# Patient Record
Sex: Male | Born: 1990 | Race: Black or African American | Hispanic: No | Marital: Single | Smoking: Never smoker
Health system: Southern US, Community
[De-identification: ages and names within clinical notes are randomized; demographics above are authoritative.]

## PROBLEM LIST (undated history)

## (undated) DIAGNOSIS — J45909 Unspecified asthma, uncomplicated: Secondary | ICD-10-CM

## (undated) HISTORY — PX: LIPOMA EXCISION: SHX5283

---

## 2012-10-18 ENCOUNTER — Encounter (HOSPITAL_COMMUNITY): Payer: Self-pay | Admitting: *Deleted

## 2012-10-18 ENCOUNTER — Emergency Department (HOSPITAL_COMMUNITY): Payer: Managed Care, Other (non HMO)

## 2012-10-18 ENCOUNTER — Emergency Department (HOSPITAL_COMMUNITY)
Admission: EM | Admit: 2012-10-18 | Discharge: 2012-10-18 | Disposition: A | Discharge status: Home / Self Care | Payer: Managed Care, Other (non HMO) | Attending: Emergency Medicine | Admitting: Emergency Medicine

## 2012-10-18 DIAGNOSIS — J45909 Unspecified asthma, uncomplicated: Secondary | ICD-10-CM | POA: Insufficient documentation

## 2012-10-18 DIAGNOSIS — IMO0002 Reserved for concepts with insufficient information to code with codable children: Secondary | ICD-10-CM | POA: Insufficient documentation

## 2012-10-18 DIAGNOSIS — Z23 Encounter for immunization: Secondary | ICD-10-CM | POA: Insufficient documentation

## 2012-10-18 DIAGNOSIS — T07XXXA Unspecified multiple injuries, initial encounter: Secondary | ICD-10-CM | POA: Insufficient documentation

## 2012-10-18 HISTORY — DX: Unspecified asthma, uncomplicated: J45.909

## 2012-10-18 MED ORDER — IBUPROFEN 800 MG PO TABS
800.0000 mg | ORAL_TABLET | Freq: Once | ORAL | Status: AC
  Administered 2012-10-18: 800 mg via ORAL
  Filled 2012-10-18: qty 1

## 2012-10-18 MED ORDER — HYDROCODONE-ACETAMINOPHEN 5-325 MG PO TABS: 2.0000 | ORAL_TABLET | ORAL | Status: DC | PRN

## 2012-10-18 MED ORDER — TETANUS-DIPHTH-ACELL PERTUSSIS 5-2.5-18.5 LF-MCG/0.5 IM SUSP
0.5000 mL | Freq: Once | INTRAMUSCULAR | Status: AC
  Administered 2012-10-18: 0.5 mL via INTRAMUSCULAR
  Filled 2012-10-18: qty 0.5

## 2012-10-18 MED ORDER — CYCLOBENZAPRINE HCL 10 MG PO TABS: 10.0000 mg | ORAL_TABLET | Freq: Two times a day (BID) | ORAL | Status: DC | PRN

## 2012-10-18 MED ORDER — IBUPROFEN 800 MG PO TABS: 800.0000 mg | ORAL_TABLET | Freq: Three times a day (TID) | ORAL | Status: DC
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
## 2012-10-18 NOTE — ED Notes (Signed)
Patient transported to X-ray 

## 2012-11-04 ENCOUNTER — Encounter (HOSPITAL_COMMUNITY): Payer: Self-pay | Admitting: Emergency Medicine

## 2012-11-04 ENCOUNTER — Emergency Department (HOSPITAL_COMMUNITY)
Admission: EM | Admit: 2012-11-04 | Discharge: 2012-11-04 | Disposition: A | Discharge status: Home / Self Care | Payer: Managed Care, Other (non HMO) | Attending: Emergency Medicine | Admitting: Emergency Medicine

## 2012-11-04 DIAGNOSIS — Y939 Activity, unspecified: Secondary | ICD-10-CM | POA: Insufficient documentation

## 2012-11-04 DIAGNOSIS — Z79899 Other long term (current) drug therapy: Secondary | ICD-10-CM | POA: Insufficient documentation

## 2012-11-04 DIAGNOSIS — IMO0002 Reserved for concepts with insufficient information to code with codable children: Secondary | ICD-10-CM

## 2012-11-04 DIAGNOSIS — Y929 Unspecified place or not applicable: Secondary | ICD-10-CM | POA: Insufficient documentation

## 2012-11-04 DIAGNOSIS — J45909 Unspecified asthma, uncomplicated: Secondary | ICD-10-CM | POA: Insufficient documentation

## 2012-11-04 DIAGNOSIS — S0180XA Unspecified open wound of other part of head, initial encounter: Secondary | ICD-10-CM | POA: Insufficient documentation

## 2012-11-04 DIAGNOSIS — X58XXXA Exposure to other specified factors, initial encounter: Secondary | ICD-10-CM | POA: Insufficient documentation

## 2012-11-04 NOTE — ED Provider Notes (Signed)
History     CSN: [id number]  Arrival date & time 11/04/12  0225   First MD Initiated Contact with Patient 11/04/12 [phone]      Chief Complaint  Patient presents with  . Laceration    (Consider location/radiation/quality/duration/timing/severity/associated sxs/prior treatment) Patient is a 22 y.o. male presenting with skin laceration. The history is provided by the patient.  Laceration  The incident occurred 1 to 2 hours ago. Pain location: left eye brow. The laceration is 3 cm in size. Injury mechanism: busted open. The pain is at a severity of 0/10. The patient is experiencing no pain. He reports no foreign bodies present. His tetanus status is UTD.    Past Medical History  Diagnosis Date  . Asthma     Past Surgical History  Procedure Date  . Lipoma excision     No family history on file.  History  Substance Use Topics  . Smoking status: Never Smoker   . Smokeless tobacco: Not on file  . Alcohol Use: Yes     Comment: occasionally      Review of Systems  Skin: Positive for wound.  All other systems reviewed and are negative.    Allergies  Singulair  Home Medications   Current Outpatient Rx  Name  Route  Sig  Dispense  Refill  . ALBUTEROL SULFATE HFA 108 (90 BASE) MCG/ACT IN AERS   Inhalation   Inhale 2 puffs into the lungs every 6 (six) hours as needed. For wheeze or shortness of breath           BP 136/77  Pulse 85  Temp 97.7 F (36.5 C) (Oral)  Resp 16  SpO2 99%  Physical Exam  Nursing note and vitals reviewed. Constitutional: He is oriented to person, place, and time. He appears well-developed and well-nourished. No distress.  HENT:  Head: Normocephalic and atraumatic.  Eyes: Conjunctivae normal and EOM are normal.  Neck: Normal range of motion.  Pulmonary/Chest: Effort normal.  Musculoskeletal: Normal range of motion.  Neurological: He is alert and oriented to person, place, and time.  Skin: Skin is warm and dry. No rash noted. He is  not diaphoretic.       3 cm superficial horizontal laceration just inferior to brow line.   Psychiatric: He has a normal mood and affect. His behavior is normal.    ED Course  Procedures (including critical care time)  Labs Reviewed - No data to display No results found. LACERATION REPAIR Performed by: Jaci Carrel Authorized by: Jaci Carrel Consent: Verbal consent obtained. Risks and benefits: risks, benefits and alternatives were discussed Consent given by: patient Patient identity confirmed: provided demographic data Prepped and Draped in normal sterile fashion Wound explored  Laceration Location: right eye brow  Laceration Length: 3cm  No Foreign Bodies seen or palpated  Anesthesia: local infiltration  Local anesthetic: lidocaine 2% w epinephrine  Anesthetic total: 2 ml  Irrigation method: syringe Amount of cleaning: standard  Skin closure: 5.0 prolene  Number of sutures: 5  Technique: simple interupted  Patient tolerance: Patient tolerated the procedure well with no immediate complications.   No diagnosis found.    MDM  Laceration  Tdap up to date. Pressure irrigation performed. Laceration occurred < 8 hours prior to repair which was well tolerated. Pt has no co morbidities to effect normal wound healing. Discussed suture home care w pt and answered questions. Pt to f-u for wound check and suture removal in 5 days. Pt is hemodynamically stable w no  complaints prior to dc.           Jaci Carrel, [place] 11/04/12 [phone]

## 2012-11-04 NOTE — ED Provider Notes (Signed)
Medical screening examination/treatment/procedure(s) were performed by non-physician practitioner and as supervising physician I was immediately available for consultation/collaboration.  Jasmine Awe, MD 11/04/12 [phone]

## 2012-11-04 NOTE — ED Notes (Signed)
Laceration to right eyebrow occurred at 0200 today while playing basketball, hit another players arm.  Last tetanus shot 2 weeks ago. Bleeding controlled

## 2013-06-11 ENCOUNTER — Emergency Department (HOSPITAL_COMMUNITY): Payer: Managed Care, Other (non HMO)

## 2013-06-11 ENCOUNTER — Inpatient Hospital Stay (HOSPITAL_COMMUNITY): Payer: Managed Care, Other (non HMO)

## 2013-06-11 ENCOUNTER — Inpatient Hospital Stay (HOSPITAL_COMMUNITY)
Admission: EM | Admit: 2013-06-11 | Discharge: 2013-06-14 | DRG: 200 | Disposition: A | Discharge status: Home / Self Care | Payer: Managed Care, Other (non HMO) | Attending: Critical Care Medicine | Admitting: Critical Care Medicine

## 2013-06-11 ENCOUNTER — Encounter (HOSPITAL_COMMUNITY): Payer: Self-pay | Admitting: *Deleted

## 2013-06-11 DIAGNOSIS — E876 Hypokalemia: Secondary | ICD-10-CM | POA: Diagnosis present

## 2013-06-11 DIAGNOSIS — J982 Interstitial emphysema: Principal | ICD-10-CM | POA: Diagnosis present

## 2013-06-11 DIAGNOSIS — J40 Bronchitis, not specified as acute or chronic: Secondary | ICD-10-CM | POA: Diagnosis present

## 2013-06-11 DIAGNOSIS — J069 Acute upper respiratory infection, unspecified: Secondary | ICD-10-CM

## 2013-06-11 DIAGNOSIS — J45901 Unspecified asthma with (acute) exacerbation: Secondary | ICD-10-CM | POA: Diagnosis present

## 2013-06-11 LAB — CBC WITH DIFFERENTIAL/PLATELET
Eosinophils Relative: 2 % (ref 0–5)
HCT: 42.3 % (ref 39.0–52.0)
Lymphocytes Relative: 8 % — ABNORMAL LOW (ref 12–46)
Lymphs Abs: 0.7 10*3/uL (ref 0.7–4.0)
MCH: 27.8 pg (ref 26.0–34.0)
MCV: 80 fL (ref 78.0–100.0)
Monocytes Absolute: 0.6 10*3/uL (ref 0.1–1.0)
Monocytes Relative: 7 % (ref 3–12)
RBC: 5.29 MIL/uL (ref 4.22–5.81)
WBC: 8.7 10*3/uL (ref 4.0–10.5)

## 2013-06-11 LAB — RAPID URINE DRUG SCREEN, HOSP PERFORMED
Amphetamines: NOT DETECTED
Benzodiazepines: NOT DETECTED
Opiates: POSITIVE — AB
Tetrahydrocannabinol: POSITIVE — AB

## 2013-06-11 LAB — POCT I-STAT, CHEM 8
BUN: 5 mg/dL — ABNORMAL LOW (ref 6–23)
Calcium, Ion: 1.11 mmol/L — ABNORMAL LOW (ref 1.12–1.23)
Creatinine, Ser: 1.1 mg/dL (ref 0.50–1.35)
Glucose, Bld: 121 mg/dL — ABNORMAL HIGH (ref 70–99)
TCO2: 26 mmol/L (ref 0–100)

## 2013-06-11 LAB — GLUCOSE, CAPILLARY: Glucose-Capillary: 126 mg/dL — ABNORMAL HIGH (ref 70–99)

## 2013-06-11 MED ORDER — ALBUTEROL SULFATE (5 MG/ML) 0.5% IN NEBU
2.5000 mg | INHALATION_SOLUTION | RESPIRATORY_TRACT | Status: DC
  Administered 2013-06-11 – 2013-06-12 (×6): 2.5 mg via RESPIRATORY_TRACT
  Filled 2013-06-11 (×6): qty 0.5

## 2013-06-11 MED ORDER — HEPARIN SODIUM (PORCINE) 5000 UNIT/ML IJ SOLN
5000.0000 [IU] | Freq: Three times a day (TID) | INTRAMUSCULAR | Status: DC
  Administered 2013-06-11 – 2013-06-14 (×9): 5000 [IU] via SUBCUTANEOUS
  Filled 2013-06-11 (×14): qty 1

## 2013-06-11 MED ORDER — ACETAMINOPHEN 325 MG PO TABS: 650.0000 mg | ORAL_TABLET | Freq: Once | ORAL | Status: DC

## 2013-06-11 MED ORDER — AZITHROMYCIN 250 MG PO TABS
250.0000 mg | ORAL_TABLET | Freq: Every day | ORAL | Status: DC
  Filled 2013-06-11: qty 2

## 2013-06-11 MED ORDER — PREDNISONE 10 MG PO TABS
10.0000 mg | ORAL_TABLET | Freq: Every day | ORAL | Status: DC
  Filled 2013-06-11: qty 2
  Filled 2013-06-11 (×2): qty 1

## 2013-06-11 MED ORDER — ALBUTEROL SULFATE (5 MG/ML) 0.5% IN NEBU
5.0000 mg | INHALATION_SOLUTION | Freq: Once | RESPIRATORY_TRACT | Status: AC
  Administered 2013-06-11: 5 mg via RESPIRATORY_TRACT
  Filled 2013-06-11: qty 1

## 2013-06-11 MED ORDER — AZITHROMYCIN 250 MG PO TABS
250.0000 mg | ORAL_TABLET | Freq: Every day | ORAL | Status: DC
  Filled 2013-06-11: qty 1

## 2013-06-11 MED ORDER — MORPHINE SULFATE 2 MG/ML IJ SOLN
2.0000 mg | INTRAMUSCULAR | Status: DC | PRN
  Administered 2013-06-11 – 2013-06-12 (×5): 2 mg via INTRAVENOUS
  Filled 2013-06-11 (×5): qty 1

## 2013-06-11 MED ORDER — SODIUM CHLORIDE 0.9 % IV SOLN
INTRAVENOUS | Status: DC
  Administered 2013-06-11: 20 mL/h via INTRAVENOUS

## 2013-06-11 MED ORDER — METHYLPREDNISOLONE SODIUM SUCC 125 MG IJ SOLR
80.0000 mg | Freq: Four times a day (QID) | INTRAMUSCULAR | Status: DC
  Administered 2013-06-11 – 2013-06-12 (×4): 80 mg via INTRAVENOUS
  Filled 2013-06-11 (×9): qty 1.28

## 2013-06-11 MED ORDER — ALBUTEROL SULFATE (5 MG/ML) 0.5% IN NEBU
2.5000 mg | INHALATION_SOLUTION | Freq: Four times a day (QID) | RESPIRATORY_TRACT | Status: DC
  Administered 2013-06-11 (×2): 2.5 mg via RESPIRATORY_TRACT
  Filled 2013-06-11 (×3): qty 0.5

## 2013-06-11 MED ORDER — POTASSIUM CHLORIDE 20 MEQ PO PACK: 40.0000 meq | PACK | Freq: Two times a day (BID) | ORAL | Status: DC

## 2013-06-11 MED ORDER — IPRATROPIUM BROMIDE 0.02 % IN SOLN
0.5000 mg | Freq: Once | RESPIRATORY_TRACT | Status: AC
  Administered 2013-06-11: 0.5 mg via RESPIRATORY_TRACT
  Filled 2013-06-11: qty 2.5

## 2013-06-11 MED ORDER — BENZONATATE 100 MG PO CAPS
200.0000 mg | ORAL_CAPSULE | Freq: Three times a day (TID) | ORAL | Status: DC | PRN
  Administered 2013-06-11: 200 mg via ORAL
  Filled 2013-06-11: qty 2

## 2013-06-11 MED ORDER — METHYLPREDNISOLONE SODIUM SUCC 125 MG IJ SOLR
125.0000 mg | Freq: Once | INTRAMUSCULAR | Status: DC
  Filled 2013-06-11: qty 2

## 2013-06-11 MED ORDER — LEVOFLOXACIN IN D5W 750 MG/150ML IV SOLN
750.0000 mg | INTRAVENOUS | Status: DC
  Administered 2013-06-11: 750 mg via INTRAVENOUS
  Filled 2013-06-11 (×2): qty 150

## 2013-06-11 MED ORDER — POTASSIUM CHLORIDE CRYS ER 20 MEQ PO TBCR
40.0000 meq | EXTENDED_RELEASE_TABLET | Freq: Two times a day (BID) | ORAL | Status: AC
  Administered 2013-06-11 (×2): 40 meq via ORAL
  Filled 2013-06-11 (×2): qty 2

## 2013-06-11 NOTE — Progress Notes (Addendum)
PCCM  Pt seen in f/u.  Pt still bronchospastic. Only on q6h BD and po pred and azithro.  Chest :  Exp/insp wheeze.   Sub cut emphysema Cor RRR   No s3 Rest of exam unremarkable  IMP: Status asthmaticus Pneumomediastinum  Plan IV levaquin IV medrol Nebs q3H Keep in icu F/u CXR  Preston Marquez Beeper  [phone]  Cell  [phone]  If no response or cell goes to voicemail, call beeper [phone]

## 2013-06-11 NOTE — ED Provider Notes (Signed)
CSN: [id number]     Arrival date & time 06/11/13  0305 History     First MD Initiated Contact with Patient 06/11/13 [phone]     Chief Complaint  Patient presents with  . Asthma  . Wheezing  . Fever   (Consider location/radiation/quality/duration/timing/severity/associated sxs/prior Treatment) Patient is a 22 y.o. male presenting with asthma, wheezing, and fever. The history is provided by the patient.  Asthma This is a recurrent problem. The current episode started 2 days ago. The problem occurs constantly. The problem has been gradually worsening. Associated symptoms include shortness of breath. Pertinent negatives include no headaches. Nothing aggravates the symptoms. Nothing relieves the symptoms. He has tried nothing for the symptoms. The treatment provided no relief.  Wheezing Severity:  Severe Severity compared to prior episodes:  Similar Onset quality:  Sudden Timing:  Constant Progression:  Worsening Chronicity:  Recurrent Context: not exercise   Relieved by:  Nothing Worsened by:  Nothing tried Ineffective treatments:  Nebulizer treatments Associated symptoms: chest tightness, fever and shortness of breath   Associated symptoms: no headaches and no sputum production   Fever:    Timing:  Constant   Temp source:  Oral Risk factors: no suspected foreign body   Fever Associated symptoms: no headaches     Past Medical History  Diagnosis Date  . Asthma    Past Surgical History  Procedure Laterality Date  . Lipoma excision     No family history on file. History  Substance Use Topics  . Smoking status: Never Smoker   . Smokeless tobacco: Not on file  . Alcohol Use: Yes     Comment: occasionally    Review of Systems  Constitutional: Positive for fever.  Respiratory: Positive for chest tightness, shortness of breath and wheezing. Negative for sputum production.   Neurological: Negative for headaches.  All other systems reviewed and are negative.    Allergies   Singulair  Home Medications   Current Outpatient Rx  Name  Route  Sig  Dispense  Refill  . azithromycin (ZITHROMAX) 250 MG tablet   Oral   Take 250-500 mg by mouth daily. START 8.22.14 END 8.27.14 for 5 days, take 2 tablets on day 1 , then 1 tablet on days 2-4.         Marland Kitchen albuterol (PROVENTIL HFA;VENTOLIN HFA) 108 (90 BASE) MCG/ACT inhaler   Inhalation   Inhale 2 puffs into the lungs every 6 (six) hours as needed. For wheeze or shortness of breath          BP 160/76  Pulse 84  Temp(Src) 100.2 F (37.9 C) (Oral)  Resp 20  SpO2 93% Physical Exam  Constitutional: He is oriented to person, place, and time. He appears well-developed and well-nourished.  HENT:  Head: Normocephalic and atraumatic.  Mouth/Throat: Oropharynx is clear and moist.  Eyes: Conjunctivae are normal. Pupils are equal, round, and reactive to light.  Neck: Normal range of motion. Neck supple.  Crepitance of the neck cw air  Cardiovascular: Intact distal pulses.  Tachycardia present.   Pulmonary/Chest: Accessory muscle usage present. Tachypnea noted. He has wheezes. He has no rhonchi. He has no rales.  Abdominal: Soft. Bowel sounds are normal. There is no tenderness. There is no rebound and no guarding.  Musculoskeletal: Normal range of motion.  Lymphadenopathy:    He has no cervical adenopathy.  Neurological: He is alert and oriented to person, place, and time.  Skin: Skin is warm and dry. He is not diaphoretic.  Psychiatric: He  has a normal mood and affect.    ED Course   Procedures (including critical care time)  Labs Reviewed  CBC WITH DIFFERENTIAL - Abnormal; Notable for the following:    Neutrophils Relative % 82 (*)    Lymphocytes Relative 8 (*)    All other components within normal limits  POCT I-STAT, CHEM 8 - Abnormal; Notable for the following:    Potassium 3.1 (*)    BUN 5 (*)    Glucose, Bld 121 (*)    Calcium, Ion 1.11 (*)    All other components within normal limits   Dg Chest  2 View  06/11/2013   *RADIOLOGY REPORT*  Clinical Data: Asthma, wheezing, fever.  CHEST - 2 VIEW  Comparison: 10/18/2012  Findings: There is fairly extensive pneumomediastinum with subcutaneous emphysema in the base of the neck and upper chest wall bilaterally.  No definite pneumothorax but superimposed changes due to pneumomediastinum could obscure small pneumothorax. Hyperinflation of the lungs. This is likely related to history of asthma.  Normal heart size and pulmonary vascularity.  No focal airspace consolidation in the lungs.  No blunting of costophrenic angles.  IMPRESSION: Extensive pneumomediastinum with subcutaneous emphysema in the neck and upper chest bilaterally.  Hyperinflation without obvious pneumothorax.  Results were telephoned to Dr. Nicanor Alcon at 0356 hours on 06/11/2013.   Original Report Authenticated By: Burman Nieves, M.D.   No diagnosis found.  MDM  MDM Reviewed: nursing note and vitals Interpretation: labs and x-ray Total time providing critical care: 30-74 minutes. This excludes time spent performing separately reportable procedures and services. Consults: pulmonary   Results for orders placed during the hospital encounter of 06/11/13  CBC WITH DIFFERENTIAL      Result Value Range   WBC 8.7  4.0 - 10.5 K/uL   RBC 5.29  4.22 - 5.81 MIL/uL   Hemoglobin 14.7  13.0 - 17.0 g/dL   HCT 40.9  81.1 - 91.4 %   MCV 80.0  78.0 - 100.0 fL   MCH 27.8  26.0 - 34.0 pg   MCHC 34.8  30.0 - 36.0 g/dL   RDW 78.2  95.6 - 21.3 %   Platelets 170  150 - 400 K/uL   Neutrophils Relative % 82 (*) 43 - 77 %   Neutro Abs 7.2  1.7 - 7.7 K/uL   Lymphocytes Relative 8 (*) 12 - 46 %   Lymphs Abs 0.7  0.7 - 4.0 K/uL   Monocytes Relative 7  3 - 12 %   Monocytes Absolute 0.6  0.1 - 1.0 K/uL   Eosinophils Relative 2  0 - 5 %   Eosinophils Absolute 0.2  0.0 - 0.7 K/uL   Basophils Relative 0  0 - 1 %   Basophils Absolute 0.0  0.0 - 0.1 K/uL  POCT I-STAT, CHEM 8      Result Value Range    Sodium 140  135 - 145 mEq/L   Potassium 3.1 (*) 3.5 - 5.1 mEq/L   Chloride 102  96 - 112 mEq/L   BUN 5 (*) 6 - 23 mg/dL   Creatinine, Ser 0.86  0.50 - 1.35 mg/dL   Glucose, Bld 578 (*) 70 - 99 mg/dL   Calcium, Ion 4.69 (*) 1.12 - 1.23 mmol/L   TCO2 26  0 - 100 mmol/L   Hemoglobin 15.6  13.0 - 17.0 g/dL   HCT 62.9  52.8 - 41.3 %   Dg Chest 2 View  06/11/2013   *RADIOLOGY REPORT*  Clinical  Data: Asthma, wheezing, fever.  CHEST - 2 VIEW  Comparison: 10/18/2012  Findings: There is fairly extensive pneumomediastinum with subcutaneous emphysema in the base of the neck and upper chest wall bilaterally.  No definite pneumothorax but superimposed changes due to pneumomediastinum could obscure small pneumothorax. Hyperinflation of the lungs. This is likely related to history of asthma.  Normal heart size and pulmonary vascularity.  No focal airspace consolidation in the lungs.  No blunting of costophrenic angles.  IMPRESSION: Extensive pneumomediastinum with subcutaneous emphysema in the neck and upper chest bilaterally.  Hyperinflation without obvious pneumothorax.  Results were telephoned to Dr. Nicanor Alcon at 0356 hours on 06/11/2013.   Original Report Authenticated By: Burman Nieves, M.D.    Medications  methylPREDNISolone sodium succinate (SOLU-MEDROL) 125 mg/2 mL injection 125 mg (not administered)  albuterol (PROVENTIL) (5 MG/ML) 0.5% nebulizer solution 5 mg (not administered)  ipratropium (ATROVENT) nebulizer solution 0.5 mg (not administered)  albuterol (PROVENTIL) (5 MG/ML) 0.5% nebulizer solution 5 mg (5 mg Nebulization Given 06/11/13 0346)   O2 therapy  CRITICAL CARE Performed by: Jasmine Awe Total critical care time: 31 minutes Critical care time was exclusive of separately billable procedures and treating other patients. Critical care was necessary to treat or prevent imminent or life-threatening deterioration. Critical care was time spent personally by me on the following  activities: development of treatment plan with patient and/or surrogate as well as nursing, discussions with consultants, evaluation of patient's response to treatment, examination of patient, obtaining history from patient or surrogate, ordering and performing treatments and interventions, ordering and review of laboratory studies, ordering and review of radiographic studies, pulse oximetry and re-evaluation of patient's condition.   Jasmine Awe, MD 06/11/13 [phone]

## 2013-06-11 NOTE — ED Notes (Signed)
C/o sob, wheezing. H/o asthma. Onset Thursday, fever noted tonight. Pt seen in a [facility] urgent care Friday morning. Given zpack and ibuprofen.sx worse tonight. Increased wob noted.

## 2013-06-11 NOTE — ED Notes (Signed)
Pt states that he took Nyquil around 0130.

## 2013-06-11 NOTE — H&P (Signed)
PULMONARY  / CRITICAL CARE MEDICINE  Name: Preston Marquez MRN: [id number] DOB: 1991/01/12    ADMISSION DATE:  06/11/2013 CONSULTATION DATE:  06/11/2013  REFERRING MD :  Nicanor Alcon PRIMARY SERVICE: Emergency Medicine  CHIEF COMPLAINT:  Chest pain and shortness of breath  BRIEF PATIENT DESCRIPTION: Preston Marquez is a 22 year old male with asthma who presents with pneumomediastinum and chest pain in the setting of an asthma exacerbation.  SIGNIFICANT EVENTS / STUDIES:  1. Chest x-ray 06/11/2013 demonstrates pneumomediastinum  LINES / TUBES: 1. None  CULTURES: 1. None  ANTIBIOTICS: 1. None  HISTORY OF PRESENT ILLNESS:  Preston Marquez is a 22 year old male with a history of mild intermittent asthma who presents to [facility] this evening with shortness of breath and chest pain. His symptoms began yesterday with the onset of shortness of breath. This morning, he woke up and his chest felt heavy. It is noted that his skin and his chest was more prominent; he notes that he looked "buff". He presented to urgent care today and was told he had an asthma exacerbation. Given the severity of his symptoms he decided to present to [facility] as well. He denies fever, chills, nausea, vomiting, rhinorrhea, sore throat. He does not use drugs and has not suffered trauma recently.  PAST MEDICAL HISTORY :  Past Medical History  Diagnosis Date  . Asthma     Past Surgical History  Procedure Laterality Date  . Lipoma excision      Prior to Admission medications   Medication Sig Start Date End Date Taking? Authorizing Provider  azithromycin (ZITHROMAX) 250 MG tablet Take 250-500 mg by mouth daily. START 8.22.14 END 8.27.14 for 5 days, take 2 tablets on day 1 , then 1 tablet on days 2-4.   Yes Historical Provider, MD  albuterol (PROVENTIL HFA;VENTOLIN HFA) 108 (90 BASE) MCG/ACT inhaler Inhale 2 puffs into the lungs every 6 (six) hours as needed. For wheeze or shortness of breath    Historical Provider, MD     Allergies  Allergen Reactions  . Singulair [Montelukast Sodium] Hives    FAMILY HISTORY:  No family history on file.  SOCIAL HISTORY:  reports that he has never smoked. He does not have any smokeless tobacco history on file. He reports that  drinks alcohol. He reports that he does not use illicit drugs.  REVIEW OF SYSTEMS:  A 12 system review systems is conducted and, unless otherwise specified in history of present illness, was negative.  PHYSICAL EXAM  VITAL SIGNS: Temp:  [100.2 F (37.9 C)] 100.2 F (37.9 C) (08/23 0310) Pulse Rate:  [84-118] 84 (08/23 0347) Resp:  [20-22] 20 (08/23 0347) BP: (160)/(76) 160/76 mmHg (08/23 0310) SpO2:  [93 %] 93 % (08/23 0310)  HEMODYNAMICS:    VENTILATOR SETTINGS:    INTAKE / OUTPUT: Intake/Output   None     PHYSICAL EXAMINATION: General:  Well-developed well-nourished male in no acute distress Neuro:  Intact HEENT: Sclera anicteric, conjunctiva pink. Mucous membranes moist, oropharynx clear. Neck:  Trachea supple in midline, no lymphadenopathy or JVD. Palpable crepitus overlying the clavicles bilaterally. Cardiovascular:  Regular rate rhythm, normal S1-S2, no murmurs rubs or gallop Lungs:  Diffuse expiratory wheezing Abdomen:  Soft, nontender, nondistended, positive bowel sounds Musculoskeletal:  No clubbing cyanosis or edema Skin:  No rashes  LABS:  CBC Recent Labs     06/11/13  0315  06/11/13  0320  WBC  8.7   --   HGB  14.7  15.6  HCT  42.3  46.0  PLT  170   --     Coag's No results found for this basename: APTT, INR,  in the last 72 hours  BMET Recent Labs     06/11/13  0320  NA  140  K  3.1*  CL  102  BUN  5*  CREATININE  1.10  GLUCOSE  121*    Electrolytes No results found for this basename: CALCIUM, MG, PHOS,  in the last 72 hours  Sepsis Markers No results found for this basename: LACTICACIDVEN, PROCALCITON, O2SATVEN,  in the last 72 hours  ABG No results found for this basename:  PHART, PCO2ART, PO2ART,  in the last 72 hours  Liver Enzymes No results found for this basename: AST, ALT, ALKPHOS, BILITOT, ALBUMIN,  in the last 72 hours  Cardiac Enzymes No results found for this basename: TROPONINI, PROBNP,  in the last 72 hours  Glucose No results found for this basename: GLUCAP,  in the last 72 hours  Imaging Dg Chest 2 View  06/11/2013   *RADIOLOGY REPORT*  Clinical Data: Asthma, wheezing, fever.  CHEST - 2 VIEW  Comparison: 10/18/2012  Findings: There is fairly extensive pneumomediastinum with subcutaneous emphysema in the base of the neck and upper chest wall bilaterally.  No definite pneumothorax but superimposed changes due to pneumomediastinum could obscure small pneumothorax. Hyperinflation of the lungs. This is likely related to history of asthma.  Normal heart size and pulmonary vascularity.  No focal airspace consolidation in the lungs.  No blunting of costophrenic angles.  IMPRESSION: Extensive pneumomediastinum with subcutaneous emphysema in the neck and upper chest bilaterally.  Hyperinflation without obvious pneumothorax.  Results were telephoned to Dr. Nicanor Alcon at 0356 hours on 06/11/2013.   Original Report Authenticated By: Burman Nieves, M.D.    EKG: Not done. CXR: Chest x-ray from today was personally reviewed by me, there is diffuse subcutaneous emphysema  ASSESSMENT / PLAN: Principal Problem:   Pneumomediastinum Active Problems:   Asthma exacerbation   Hypokalemia   PULMONARY A: 1. Acute asthma exacerbation: This is the most likely underlying explanation for the patient's presentation. There is no evidence of pneumonia on chest x-ray.  P:    Prednisone taper  Standing and albuterol every 6  Minimize cough  CARDIOVASCULAR A:  1. Pneumomediastinum: This is most likely secondary to the patient's asthma exacerbation. He denies trauma or drug use which are also in the differential.  P:  Pain control  Minimize cough  Chest  CT  UDS  CT surgery evaluation in the morning  RENAL A:  1.  Hypokalemia 2. No other acute issues; the patient's creatinine is almost certainly secondary to his muscular in build:   P:     Repeat K.  GASTROINTESTINAL A:  No acute issue  HEMATOLOGIC A:   No acute issue  INFECTIOUS A: No acute issue  ENDOCRINE A:    No acute issue  NEUROLOGIC A:  No acute issue  BEST PRACTICE / DISPOSITION Level of Care:  ICU Consultants:   cardiothoracic surgery in the morning Code Status:   full Diet:   regular DVT Px:   ambulatory GI Px:   not indicated Skin Integrity:   intact Social / Family:  patient able to advise  TODAY'S SUMMARY:   I have personally obtained a history, examined the patient, evaluated laboratory and imaging results, formulated the assessment and plan and placed orders.  CRITICAL CARE: The patient is critically ill with multiple organ systems failure and requires  high complexity decision making for assessment and support, frequent evaluation and titration of therapies, application of advanced monitoring technologies and extensive interpretation of multiple databases. Critical Care Time devoted to patient care services described in this note is 30 minutes.   Evalyn Casco, MD Pulmonary and Critical Care Medicine [facility] Pager: [phone]  06/11/2013, 4:36 AM

## 2013-06-12 ENCOUNTER — Inpatient Hospital Stay (HOSPITAL_COMMUNITY): Payer: Managed Care, Other (non HMO)

## 2013-06-12 DIAGNOSIS — J45901 Unspecified asthma with (acute) exacerbation: Secondary | ICD-10-CM

## 2013-06-12 DIAGNOSIS — J982 Interstitial emphysema: Principal | ICD-10-CM

## 2013-06-12 LAB — BASIC METABOLIC PANEL
BUN: 12 mg/dL (ref 6–23)
CO2: 27 mEq/L (ref 19–32)
Calcium: 10.2 mg/dL (ref 8.4–10.5)
Chloride: 100 mEq/L (ref 96–112)
Creatinine, Ser: 1.01 mg/dL (ref 0.50–1.35)
Glucose, Bld: 128 mg/dL — ABNORMAL HIGH (ref 70–99)

## 2013-06-12 LAB — CBC
HCT: 42 % (ref 39.0–52.0)
MCH: 27.7 pg (ref 26.0–34.0)
MCHC: 34.8 g/dL (ref 30.0–36.0)
MCV: 79.7 fL (ref 78.0–100.0)
Platelets: 204 10*3/uL (ref 150–400)
RDW: 14.6 % (ref 11.5–15.5)
WBC: 8.1 10*3/uL (ref 4.0–10.5)

## 2013-06-12 MED ORDER — ALBUTEROL SULFATE (5 MG/ML) 0.5% IN NEBU
2.5000 mg | INHALATION_SOLUTION | Freq: Four times a day (QID) | RESPIRATORY_TRACT | Status: DC
  Administered 2013-06-12 – 2013-06-14 (×6): 2.5 mg via RESPIRATORY_TRACT
  Filled 2013-06-12 (×8): qty 0.5

## 2013-06-12 MED ORDER — LEVOFLOXACIN 750 MG PO TABS
750.0000 mg | ORAL_TABLET | Freq: Every day | ORAL | Status: DC
  Administered 2013-06-12 – 2013-06-14 (×3): 750 mg via ORAL
  Filled 2013-06-12 (×3): qty 1

## 2013-06-12 MED ORDER — METHYLPREDNISOLONE SODIUM SUCC 40 MG IJ SOLR
40.0000 mg | Freq: Four times a day (QID) | INTRAMUSCULAR | Status: DC
  Administered 2013-06-12 – 2013-06-13 (×5): 40 mg via INTRAVENOUS
  Filled 2013-06-12 (×10): qty 1

## 2013-06-12 MED ORDER — ALBUTEROL SULFATE (5 MG/ML) 0.5% IN NEBU
2.5000 mg | INHALATION_SOLUTION | RESPIRATORY_TRACT | Status: DC
  Administered 2013-06-12 (×2): 2.5 mg via RESPIRATORY_TRACT
  Filled 2013-06-12 (×2): qty 0.5

## 2013-06-12 NOTE — Progress Notes (Signed)
Report called to [place] on 6N. Pt transported without incident. VSS, in NAD.

## 2013-06-12 NOTE — Progress Notes (Signed)
PULMONARY  / CRITICAL CARE MEDICINE  Name: Preston Marquez MRN: [id number] DOB: 10-27-1990    ADMISSION DATE:  06/11/2013 CONSULTATION DATE:  06/11/2013  REFERRING MD :  Nicanor Alcon PRIMARY SERVICE: Emergency Medicine  CHIEF COMPLAINT:  Chest pain and shortness of breath  BRIEF PATIENT DESCRIPTION: Preston Marquez is a 22 year old male with asthma who presents with pneumomediastinum and chest pain in the setting of an asthma exacerbation.  SIGNIFICANT EVENTS / STUDIES:    LINES / TUBES: PIV  CULTURES: 1. None  ANTIBIOTICS: Levaquin 8/23>>   PHYSICAL EXAM  VITAL SIGNS: Temp:  [98 F (36.7 C)-98.9 F (37.2 C)] 98.4 F (36.9 C) (08/24 0731) Pulse Rate:  [56-114] 88 (08/24 0600) Resp:  [15-31] 25 (08/24 0600) BP: (98-148)/(59-85) 140/85 mmHg (08/24 0600) SpO2:  [90 %-100 %] 94 % (08/24 0747) Weight:  [82.9 kg (182 lb 12.2 oz)] 82.9 kg (182 lb 12.2 oz) (08/24 0500) On RA         INTAKE / OUTPUT: Intake/Output     08/23 0701 - 08/24 0700 08/24 0701 - 08/25 0700   P.O. 2000    I.V. (mL/kg) 460 (5.5)    IV Piggyback 100    Total Intake(mL/kg) 2560 (30.9)    Urine (mL/kg/hr) 1100 (0.6)    Total Output 1100     Net +1460          Urine Occurrence 3 x      PHYSICAL EXAMINATION: General:  Well-developed well-nourished male in no acute distress Neuro:  Intact HEENT: Sclera anicteric, conjunctiva pink. Mucous membranes moist, oropharynx clear. Neck:  Trachea supple in midline, no lymphadenopathy or JVD. Palpable crepitus overlying the clavicles bilaterally. Cardiovascular:  Regular rate rhythm, normal S1-S2, no murmurs rubs or gallop Lungs:  Improved BS Abdomen:  Soft, nontender, nondistended, positive bowel sounds Musculoskeletal:  No clubbing cyanosis or edema Skin:  No rashes  LABS:  CBC Recent Labs     06/11/13  0315  06/11/13  0320  06/12/13  0410  WBC  8.7   --   8.1  HGB  14.7  15.6  14.6  HCT  42.3  46.0  42.0  PLT  170   --   204    Coag's No results  found for this basename: APTT, INR,  in the last 72 hours  BMET Recent Labs     06/11/13  0320  06/12/13  0410  NA  140  137  K  3.1*  4.8  CL  102  100  CO2   --   27  BUN  5*  12  CREATININE  1.10  1.01  GLUCOSE  121*  128*    Electrolytes Recent Labs     06/12/13  0410  CALCIUM  10.2    Sepsis Markers No results found for this basename: LACTICACIDVEN, PROCALCITON, O2SATVEN,  in the last 72 hours  ABG No results found for this basename: PHART, PCO2ART, PO2ART,  in the last 72 hours  Liver Enzymes No results found for this basename: AST, ALT, ALKPHOS, BILITOT, ALBUMIN,  in the last 72 hours  Cardiac Enzymes No results found for this basename: TROPONINI, PROBNP,  in the last 72 hours  Glucose Recent Labs     06/11/13  0616  GLUCAP  126*    Imaging  EKG: Not done. YQM:[id number] pneumomediastinum, bronchial thickening L lung, hyperinflated lungs ASSESSMENT / PLAN: Principal Problem:   Pneumomediastinum Active Problems:   Asthma exacerbation   Hypokalemia   PULMONARY A: 1.  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 No active issues  P:  Monitor  RENAL A:  1.  Hypokalemia resolved 2. No other acute issues; the patient's creatinine is almost certainly secondary to his muscular in build:   P:    monitor  GASTROINTESTINAL A:  No acute issue  HEMATOLOGIC A:   No acute issue  INFECTIOUS A: On levaquin for tracheobronchitis  ENDOCRINE A:    No acute issue  NEUROLOGIC A:  No acute issue   TODAY'S SUMMARY:  22 y.o.AAM with status asthmaticus and pneumomediastinum with tracheobronchitis. Plan is to cont steroids, BD, po abx and add flutter valve.  Keep in ICU this am  I have personally obtained a history, examined the patient, evaluated laboratory and imaging results,  formulated the assessment and plan and placed orders.  CRITICAL CARE: The patient is critically ill with multiple organ systems failure and requires high complexity decision making for assessment and support, frequent evaluation and titration of therapies, application of advanced monitoring technologies and extensive interpretation of multiple databases. Critical Care Time devoted to patient care services described in this note is 30 minutes.   Dorcas Carrow Beeper  [phone]  Cell  [phone]  If no response or cell goes to voicemail, call beeper [phone] Pulmonary and Critical Care Medicine [facility] Pager: [phone]  06/12/2013, 8:07 AM

## 2013-06-13 ENCOUNTER — Inpatient Hospital Stay (HOSPITAL_COMMUNITY): Payer: Managed Care, Other (non HMO)

## 2013-06-13 LAB — BASIC METABOLIC PANEL
BUN: 27 mg/dL — ABNORMAL HIGH (ref 6–23)
Chloride: 97 mEq/L (ref 96–112)
Glucose, Bld: 125 mg/dL — ABNORMAL HIGH (ref 70–99)
Potassium: 4.4 mEq/L (ref 3.5–5.1)

## 2013-06-13 LAB — CBC
HCT: 40.9 % (ref 39.0–52.0)
Hemoglobin: 14.4 g/dL (ref 13.0–17.0)
MCH: 28.1 pg (ref 26.0–34.0)
MCHC: 35.2 g/dL (ref 30.0–36.0)

## 2013-06-13 MED ORDER — FLUTICASONE PROPIONATE HFA 110 MCG/ACT IN AERO
2.0000 | INHALATION_SPRAY | Freq: Two times a day (BID) | RESPIRATORY_TRACT | Status: DC
  Administered 2013-06-14: 2 via RESPIRATORY_TRACT
  Filled 2013-06-13 (×2): qty 12

## 2013-06-13 MED ORDER — FLUTICASONE PROPIONATE 50 MCG/ACT NA SUSP
2.0000 | Freq: Every day | NASAL | Status: DC
  Administered 2013-06-13 – 2013-06-14 (×2): 2 via NASAL
  Filled 2013-06-13 (×2): qty 16

## 2013-06-13 MED ORDER — LORATADINE 10 MG PO TABS
10.0000 mg | ORAL_TABLET | Freq: Every day | ORAL | Status: DC
  Administered 2013-06-13 – 2013-06-14 (×2): 10 mg via ORAL
  Filled 2013-06-13 (×2): qty 1

## 2013-06-13 MED ORDER — PREDNISONE 20 MG PO TABS
40.0000 mg | ORAL_TABLET | Freq: Every day | ORAL | Status: DC
  Administered 2013-06-14: 40 mg via ORAL
  Filled 2013-06-13 (×2): qty 2

## 2013-06-13 MED ORDER — BUDESONIDE-FORMOTEROL FUMARATE 160-4.5 MCG/ACT IN AERO
2.0000 | INHALATION_SPRAY | Freq: Two times a day (BID) | RESPIRATORY_TRACT | Status: DC
  Administered 2013-06-13: 2 via RESPIRATORY_TRACT
  Filled 2013-06-13: qty 6

## 2013-06-13 NOTE — Progress Notes (Signed)
PULMONARY  / CRITICAL CARE MEDICINE  Name: Preston Marquez MRN: [id number] DOB: 1990/12/13    ADMISSION DATE:  06/11/2013 CONSULTATION DATE:  06/11/2013  REFERRING MD :  Nicanor Alcon PRIMARY SERVICE: Emergency Medicine  CHIEF COMPLAINT:  Chest pain and shortness of breath  BRIEF PATIENT DESCRIPTION: Preston Marquez is a 22 year old male with asthma who presents with pneumomediastinum and chest pain in the setting of an asthma exacerbation.  SIGNIFICANT EVENTS / STUDIES:    LINES / TUBES: PIV  CULTURES: 1. None  ANTIBIOTICS: Levaquin 8/23>>   PHYSICAL EXAM  VITAL SIGNS: Temp:  [97.6 F (36.4 C)-98.4 F (36.9 C)] 97.6 F (36.4 C) (08/25 0655) Pulse Rate:  [74-94] 74 (08/25 0655) Resp:  [18-25] 18 (08/25 0655) BP: (138-155)/(65-76) 146/65 mmHg (08/25 0655) SpO2:  [94 %-97 %] 96 % (08/25 1320) Weight:  [86.183 kg (190 lb)] 86.183 kg (190 lb) (08/24 1649) On RA         INTAKE / OUTPUT: Intake/Output     08/24 0701 - 08/25 0700 08/25 0701 - 08/26 0700   P.O. 1360    I.V. (mL/kg) 60 (0.7)    IV Piggyback     Total Intake(mL/kg) 1420 (16.5)    Urine (mL/kg/hr) 1900 (0.9)    Total Output 1900     Net -480          Urine Occurrence 2 x      PHYSICAL EXAMINATION: General:  Well-developed well-nourished male in no acute distress Neuro:  Intact HEENT: Sclera anicteric, conjunctiva pink. Mucous membranes moist, oropharynx clear. Neck:  Trachea supple in midline, no lymphadenopathy or JVD. Palpable crepitus overlying the clavicles bilaterally. Cardiovascular:  Regular rate rhythm, normal S1-S2, no murmurs rubs or gallop Lungs:  Exp wheeze  Abdomen:  Soft, nontender, nondistended, positive bowel sounds Musculoskeletal:  No clubbing cyanosis or edema Skin:  No rashes  LABS:  CBC Recent Labs     06/11/13  0315  06/11/13  0320  06/12/13  0410  06/13/13  0550  WBC  8.7   --   8.1  18.9*  HGB  14.7  15.6  14.6  14.4  HCT  42.3  46.0  42.0  40.9  PLT  170   --   204  251     Coag's No results found for this basename: APTT, INR,  in the last 72 hours  BMET Recent Labs     06/11/13  0320  06/12/13  0410  06/13/13  0550  NA  140  137  134*  K  3.1*  4.8  4.4  CL  102  100  97  CO2   --   27  25  BUN  5*  12  27*  CREATININE  1.10  1.01  1.10  GLUCOSE  121*  128*  125*    Electrolytes Recent Labs     06/12/13  0410  06/13/13  0550  CALCIUM  10.2  9.6    Sepsis Markers No results found for this basename: LACTICACIDVEN, PROCALCITON, O2SATVEN,  in the last 72 hours  ABG No results found for this basename: PHART, PCO2ART, PO2ART,  in the last 72 hours  Liver Enzymes No results found for this basename: AST, ALT, ALKPHOS, BILITOT, ALBUMIN,  in the last 72 hours  Cardiac Enzymes No results found for this basename: TROPONINI, PROBNP,  in the last 72 hours  Glucose Recent Labs     06/11/13  0616  GLUCAP  126*    Imaging Still  has some small mediastinum   EKG: Not done. 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 No sig change in CXR, feels much better.   P:   Change to oral pred  Add flovent  D/c scheduled NEBS Add decongestant and nasal steroid F/u cxr If ok will d/c in am     BABCOCK,PETE 06/13/2013, 2:24 PM    I have interviewed and examined the patient and reviewed the database. I have formulated the assessment and plan as reflected in the note above with amendments made by me.   Billy Fischer, MD;  PCCM service; Mobile [phone]

## 2013-06-13 NOTE — Discharge Summary (Signed)
Physician Discharge Summary     Patient ID: Preston Marquez MRN: [id number] DOB/AGE: 11/08/1990 22 y.o.  Admit date: 06/11/2013 Discharge date: 06/14/2013  Discharge Diagnoses:  Acute asthmatic exacerbation Pneumomediastinum  Tracheobronchitis (acute)  Detailed Hospital Course:   Mr. Preston Marquez is a 22 year old male with a history of mild intermittent asthma who presents to [facility] on 8/23 with shortness of breath and chest pain. His symptoms began on 8/22 with the onset of shortness of breath. The  morning of 8/23, he woke up and his chest felt heavy. It is noted that his skin and his chest was more prominent; he notes that he looked "buff". He presented to urgent care and was told he had an asthma exacerbation. Given the severity of his symptoms he decided to present to [facility] as well. CXR was obtained that revealed pneumomediastinum. He was admitted for asthmatic exacerbation and monitoring of pneumomediastinum.   He was admitted to the ICU. Therapeutic interventions included: supplemental oxygen, scheduled bronchodilators, empiric levaquin and systemic steroids for his exacerbation. In regards to the pneumomediastinum this was observed with serial chest x-rays w/out sig change. A follow up CXR was obtained on 8/25. This showed no interval change. His symptoms had improved from a pulmonary standpoint, but he still has significant [place] air, so we elected to monitor him for another 24 hrs. On f/u CXR 8/26 CXR showed decreased air,  and we therefore sent him home w/ plan as outlined below.    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  Cont SABA PRN Add Flovent   Significant Hospital tests/ studies/ interventions and procedures   LINES / TUBES:  PIV  CULTURES:   1. None 2.  ANTIBIOTICS:  Levaquin 8/23 (5d course)   Discharge Exam: BP 133/65  Pulse 57  Temp(Src) 97.9 F (36.6 C) (Oral)  Resp 18  Ht 6' (1.829 m)  Wt 86.183 kg (190 lb)  BMI 25.76 kg/m2  SpO2 99%  General: Well-developed well-nourished male in no acute distress  Neuro: Intact  HEENT: Sclera anicteric, conjunctiva pink. Mucous membranes moist, oropharynx clear.  Neck: Trachea supple in midline, no lymphadenopathy or JVD. Palpable crepitus overlying the clavicles bilaterally, improving .  Cardiovascular: Regular rate rhythm, normal S1-S2, no murmurs rubs or gallop  Lungs: Improved BS, some faint wheeze which have improved.  Abdomen: Soft, nontender, nondistended, positive bowel sounds  Musculoskeletal: No clubbing cyanosis or edema  Skin: No rashes   Labs at discharge Lab Results  Component Value Date   CREATININE 1.10 06/13/2013   BUN 27* 06/13/2013   NA 134* 06/13/2013   K 4.4 06/13/2013   CL 97 06/13/2013   CO2 25 06/13/2013   Lab Results  Component Value Date   WBC 18.9* 06/13/2013   HGB 14.4 06/13/2013   HCT 40.9 06/13/2013   MCV 79.9 06/13/2013   PLT 251 06/13/2013   No results found for this basename: ALT,  AST,  GGT,  ALKPHOS,  BILITOT   No results found for this basename: INR,  PROTIME    Current radiology studies Dg Chest 2 View  06/14/2013   *RADIOLOGY REPORT*  Clinical Data: Pneumomediastinum  CHEST - 2 VIEW  Comparison: 06/13/2013  Findings: Improving appearance of pneumomediastinum and subcutaneous emphysema in the chest wall and base of the neck.  No pneumothorax  is identified.  No airspace consolidation, pulmonary edema or pleural fluid is seen.  The heart size is normal.  IMPRESSION: Improving pneumomediastinum and subcutaneous emphysema.   Original Report Authenticated By: Irish Lack, M.D.   Dg Chest 2 View  06/13/2013   *RADIOLOGY REPORT*  Clinical Data: Follow up pneumomediastinum.  CHEST - 2 VIEW  Comparison: 06/12/2013, 1834 hours.   Findings: Pneumomediastinum and soft tissue emphysema is similar to the prior exam.  There is no pneumothorax.  Cardiopericardial silhouette and mediastinal contours are within normal limits.  No pleural effusion.  IMPRESSION: No interval change in the pneumomediastinum and soft tissue emphysema.   Original Report Authenticated By: Andreas Newport, M.D.   Dg Chest 2 View  06/13/2013   *RADIOLOGY REPORT*  Clinical Data: Pneumomediastinum, history of asthma  CHEST - 2 VIEW  Comparison: 06/12/2013; 06/11/2013; chest CT - 06/11/2013  Findings:  Grossly unchanged cardiac silhouette and mediastinal contours. Grossly unchanged persistent pneumomediastinum and subcutaneous emphysema about the superior lateral aspect of the thorax.  No definite pneumothorax.  The lungs remain hyperexpanded.  No focal airspace opacities.  No pleural effusion or evidence of edema. Unchanged bones.  IMPRESSION: No significant interval change in the amount of pneumomediastinum and subcutaneous emphysema.  No definite pneumothorax.   Original Report Authenticated By: Tacey Ruiz, MD    Disposition:  01-Home or Self Care      Discharge Orders   Future Appointments Provider Department Dept Phone   06/16/2013 4:30 PM Storm Frisk, MD [facility] [phone]   06/22/2013 11:00 AM Storm Frisk, MD [facility] [phone]   Future Orders Complete By Expires   Call MD for:  severe uncontrolled pain  As directed    Call MD for:  temperature >100.4  As directed    Diet - low sodium heart healthy  As directed    Discharge instructions  As directed    Comments:     If you get short breath or have new Chest pain or shortness or breath present to ER   Increase activity slowly  As directed    Scheduling Instructions:     No lifting over 5-10lbs No strenuous activity until cleared by Dr Delford Field  May return to work       Medication List    STOP taking these medications       azithromycin 250  MG tablet  Commonly known as:  ZITHROMAX      TAKE these medications       albuterol 108 (90 BASE) MCG/ACT inhaler  Commonly known as:  PROVENTIL HFA;VENTOLIN HFA  Inhale 2 puffs into the lungs every 6 (six) hours as needed. For wheeze or shortness of breath     fluticasone 110 MCG/ACT inhaler  Commonly known as:  FLOVENT HFA  Inhale 2 puffs into the lungs 2 (two) times daily.     fluticasone 50 MCG/ACT nasal spray  Commonly known as:  FLONASE  Place 2 sprays into the nose daily as needed for rhinitis.     ibuprofen 800 MG tablet  Commonly known as:  ADVIL,MOTRIN  Take 800 mg by mouth every 8 (eight) hours as needed for pain.     levofloxacin 750 MG tablet  Commonly known as:  LEVAQUIN  Take 1 tablet (750 mg total) by mouth daily.     loratadine 10 MG tablet  Commonly known as:  CLARITIN  Take 1 tablet (10 mg total) by mouth daily as needed for allergies (  nasal congestion).     NYQUIL PO  Take 10 mLs by mouth every 6 (six) hours as needed (cold symptoms).     predniSONE 10 MG tablet  Commonly known as:  DELTASONE  Take 4 tabs  daily with food x 2 days, then 3 tabs daily x 2 days, then 2 tabs daily x 2 days, then 1 tab daily x2 days then stop. #20       Follow-up Information   Follow up with Shan Levans, MD On 06/16/2013. (arrive at 4, for check in then xray, then see Dr Delford Field )    Specialty:  Pulmonary Disease   Contact information:   [address]       Discharged Condition: good  Physician Statement:   The Patient was personally examined, the discharge assessment and plan has been personally reviewed and I agree with ACNP Babcock's assessment and plan. > 30 minutes of time have been dedicated to discharge assessment, planning and discharge instructions.   SignedAnders Simmonds 06/14/2013, 1:33 PM   Agree with above  Billy Fischer, MD ; [facility] [phone].  After 5:30 PM or weekends, call [phone]

## 2013-06-14 ENCOUNTER — Inpatient Hospital Stay (HOSPITAL_COMMUNITY): Payer: Managed Care, Other (non HMO)

## 2013-06-14 DIAGNOSIS — J069 Acute upper respiratory infection, unspecified: Secondary | ICD-10-CM

## 2013-06-14 MED ORDER — FLUTICASONE PROPIONATE HFA 110 MCG/ACT IN AERO: 2.0000 | INHALATION_SPRAY | Freq: Two times a day (BID) | RESPIRATORY_TRACT | Status: DC

## 2013-06-14 MED ORDER — LEVOFLOXACIN 750 MG PO TABS: 750.0000 mg | ORAL_TABLET | Freq: Every day | ORAL | Status: DC

## 2013-06-14 MED ORDER — PREDNISONE 10 MG PO TABS: ORAL_TABLET | ORAL | Status: DC

## 2013-06-14 MED ORDER — LORATADINE 10 MG PO TABS: 10.0000 mg | ORAL_TABLET | Freq: Every day | ORAL | Status: DC | PRN

## 2013-06-14 MED ORDER — FLUTICASONE PROPIONATE 50 MCG/ACT NA SUSP: 2.0000 | Freq: Every day | NASAL | Status: DC | PRN

## 2013-06-14 NOTE — Progress Notes (Signed)
DC home, verbally understood DC instructions, no questions ask 

## 2013-06-16 ENCOUNTER — Ambulatory Visit (INDEPENDENT_AMBULATORY_CARE_PROVIDER_SITE_OTHER): Payer: Managed Care, Other (non HMO) | Admitting: Critical Care Medicine

## 2013-06-16 ENCOUNTER — Inpatient Hospital Stay: Payer: Managed Care, Other (non HMO) | Admitting: Adult Health

## 2013-06-16 ENCOUNTER — Encounter: Payer: Self-pay | Admitting: Critical Care Medicine

## 2013-06-16 ENCOUNTER — Ambulatory Visit (HOSPITAL_BASED_OUTPATIENT_CLINIC_OR_DEPARTMENT_OTHER)
Admission: RE | Admit: 2013-06-16 | Discharge: 2013-06-16 | Disposition: A | Discharge status: Home / Self Care | Payer: Managed Care, Other (non HMO) | Source: Ambulatory Visit | Attending: Critical Care Medicine | Admitting: Critical Care Medicine

## 2013-06-16 VITALS — BP 130/78 | HR 61 | Temp 98.2°F | Ht 72.0 in | Wt 189.0 lb

## 2013-06-16 DIAGNOSIS — J982 Interstitial emphysema: Secondary | ICD-10-CM | POA: Insufficient documentation

## 2013-06-16 DIAGNOSIS — J4541 Moderate persistent asthma with (acute) exacerbation: Secondary | ICD-10-CM

## 2013-06-16 DIAGNOSIS — J45901 Unspecified asthma with (acute) exacerbation: Secondary | ICD-10-CM

## 2013-06-16 DIAGNOSIS — J45909 Unspecified asthma, uncomplicated: Secondary | ICD-10-CM | POA: Insufficient documentation

## 2013-06-16 MED ORDER — FLUTICASONE PROPIONATE 50 MCG/ACT NA SUSP: 2.0000 | Freq: Every day | NASAL | Status: DC | PRN

## 2013-06-16 NOTE — Progress Notes (Signed)
Subjective:    Patient ID: Preston Marquez, male    DOB: Dec 19, 1990, 22 y.o.   MRN: [id number]  Asthma He complains of sputum production. There is no chest tightness, cough, difficulty breathing, frequent throat clearing, hemoptysis, hoarse voice, shortness of breath or wheezing. This is a chronic problem. The problem has been rapidly improving. Pertinent negatives include no appetite change, chest pain, dyspnea on exertion, ear congestion, ear pain, fever, headaches, heartburn, malaise/fatigue, myalgias, nasal congestion, orthopnea, PND, postnasal drip, rhinorrhea, sneezing, sore throat, sweats or trouble swallowing. His past medical history is significant for asthma.   This is a 22 year old African American male who was hospitalized a week ago for asthma exacerbation and subsequently developed pneumomediastinum. The patient was discharged on inhaled steroid and tapering prednisone along with oral antibiotic. The patient is markedly better. His left shortness of breath. There is no wheezing. There is no excess mucus production. Chest pain is now completely resolved.  Pt denies any significant sore throat, nasal congestion or excess secretions, fever, chills, sweats, unintended weight loss, pleurtic or exertional chest pain, orthopnea PND, or leg swelling Pt denies any increase in rescue therapy over baseline, denies waking up needing it or having any early am or nocturnal exacerbations of coughing/wheezing/or dyspnea. Pt also denies any obvious fluctuation in symptoms with  weather or environmental change or other alleviating or aggravating factors    Past Medical History  Diagnosis Date  . Asthma      No family history on file.   History   Social History  . Marital Status: Single    Spouse Name: N/A    Number of Children: N/A  . Years of Education: N/A   Occupational History  . Not on file.   Social History Main Topics  . Smoking status: Never Smoker   . Smokeless tobacco: Never Used   . Alcohol Use: Yes     Comment: occasionally  . Drug Use: No  . Sexual Activity: Not on file   Other Topics Concern  . Not on file   Social History Narrative  . No narrative on file     Allergies  Allergen Reactions  . Singulair [Montelukast Sodium] Hives     Outpatient Prescriptions Prior to Visit  Medication Sig Dispense Refill  . albuterol (PROVENTIL HFA;VENTOLIN HFA) 108 (90 BASE) MCG/ACT inhaler Inhale 2 puffs into the lungs every 6 (six) hours as needed. For wheeze or shortness of breath      . fluticasone (FLOVENT HFA) 110 MCG/ACT inhaler Inhale 2 puffs into the lungs 2 (two) times daily.  1 Inhaler  12  . ibuprofen (ADVIL,MOTRIN) 800 MG tablet Take 800 mg by mouth every 8 (eight) hours as needed for pain.      Marland Kitchen levofloxacin (LEVAQUIN) 750 MG tablet Take 1 tablet (750 mg total) by mouth daily.  3 tablet  0  . predniSONE (DELTASONE) 10 MG tablet Take 4 tabs  daily with food x 2 days, then 3 tabs daily x 2 days, then 2 tabs daily x 2 days, then 1 tab daily x2 days then stop. #20  20 tablet  0  . Pseudoeph-Doxylamine-DM-APAP (NYQUIL PO) Take 10 mLs by mouth every 6 (six) hours as needed (cold symptoms).      . fluticasone (FLONASE) 50 MCG/ACT nasal spray Place 2 sprays into the nose daily as needed for rhinitis.  16 g  2  . loratadine (CLARITIN) 10 MG tablet Take 1 tablet (10 mg total) by mouth daily as needed for allergies (  nasal congestion).       No facility-administered medications prior to visit.     Review of Systems  Constitutional: Negative for fever, malaise/fatigue and appetite change.  HENT: Negative for ear pain, sore throat, hoarse voice, rhinorrhea, sneezing, trouble swallowing and postnasal drip.   Respiratory: Positive for sputum production. Negative for cough, hemoptysis, shortness of breath and wheezing.   Cardiovascular: Negative for chest pain, dyspnea on exertion and PND.  Gastrointestinal: Negative for heartburn.  Musculoskeletal: Negative for  myalgias.  Neurological: Negative for headaches.       Objective:   Physical Exam Filed Vitals:   06/16/13 1621  BP: 130/78  Pulse: 61  Temp: 98.2 F (36.8 C)  TempSrc: Oral  Height: 6' (1.829 m)  Weight: 189 lb (85.73 kg)  SpO2: 96%    Gen: Pleasant, well-nourished, in no distress,  normal affect  ENT: No lesions,  mouth clear,  oropharynx clear, no postnasal drip  Neck: No JVD, no TMG, no carotid bruits  Lungs: No use of accessory muscles, no dullness to percussion, clear without rales or rhonchi, marked improvement in expired wheezes  Cardiovascular: RRR, heart sounds normal, no murmur or gallops, no peripheral edema  Abdomen: soft and NT, no HSM,  BS normal  Musculoskeletal: No deformities, no cyanosis or clubbing  Neuro: alert, non focal  Skin: Warm, no lesions or rashes  Dg Chest 2 View  06/16/2013   *RADIOLOGY REPORT*  Clinical Data: Asthma, follow up pneumomediastinum  CHEST - 2 VIEW  Comparison: 06/14/2013  Findings: Cardiomediastinal silhouette is stable.  No acute infiltrate or pulmonary edema.  Small residual pneumomediastinum with improvement from prior exam.  Significant improvement in subcutaneous emphysema upper chest wall.  No diagnostic pneumothorax.  IMPRESSION:   No acute infiltrate or pulmonary edema.  Small residual pneumomediastinum with improvement from prior exam.  Significant improvement in subcutaneous emphysema upper chest wall.  No diagnostic pneumothorax.   Original Report Authenticated By: Natasha Mead, M.D.          Assessment & Plan:   Asthma exacerbation Severe persistent asthma with recent exacerbation resulting in pneumomediastinum now resolving Plan Maintain inhaled steroid Flovent 2 puffs twice daily Taper prednisone to off Finish current course of antibiotics  Pneumomediastinum Pneumomediastinum now resolving with treatment of asthma exacerbation Plan No additional imaging is necessary  note this patient was given extensive  education as to proper use of the peak flow meter and the best peak flow rate measured at this visit was 650  Updated Medication List Outpatient Encounter Prescriptions as of 06/16/2013  Medication Sig Dispense Refill  . albuterol (PROVENTIL HFA;VENTOLIN HFA) 108 (90 BASE) MCG/ACT inhaler Inhale 2 puffs into the lungs every 6 (six) hours as needed. For wheeze or shortness of breath      . fluticasone (FLONASE) 50 MCG/ACT nasal spray Place 2 sprays into the nose daily as needed for rhinitis.  16 g  6  . fluticasone (FLOVENT HFA) 110 MCG/ACT inhaler Inhale 2 puffs into the lungs 2 (two) times daily.  1 Inhaler  12  . ibuprofen (ADVIL,MOTRIN) 800 MG tablet Take 800 mg by mouth every 8 (eight) hours as needed for pain.      Marland Kitchen levofloxacin (LEVAQUIN) 750 MG tablet Take 1 tablet (750 mg total) by mouth daily.  3 tablet  0  . predniSONE (DELTASONE) 10 MG tablet Take 4 tabs  daily with food x 2 days, then 3 tabs daily x 2 days, then 2 tabs daily x 2 days, then  1 tab daily x2 days then stop. #20  20 tablet  0  . Pseudoeph-Doxylamine-DM-APAP (NYQUIL PO) Take 10 mLs by mouth every 6 (six) hours as needed (cold symptoms).      . [DISCONTINUED] fluticasone (FLONASE) 50 MCG/ACT nasal spray Place 2 sprays into the nose daily as needed for rhinitis.  16 g  2  . loratadine (CLARITIN) 10 MG tablet Take 1 tablet (10 mg total) by mouth daily as needed for allergies (nasal congestion).       No facility-administered encounter medications on file as of 06/16/2013.

## 2013-06-16 NOTE — Patient Instructions (Addendum)
Finish prednisone and levaquin Stay on flovent and flonase twice daily Return 2 months [facility]  Use peak flow meter twice daily, call if peak flow rate is </= 450

## 2013-06-17 NOTE — Assessment & Plan Note (Signed)
Severe persistent asthma with recent exacerbation resulting in pneumomediastinum now resolving Plan Maintain inhaled steroid Flovent 2 puffs twice daily Taper prednisone to off Finish current course of antibiotics

## 2013-06-17 NOTE — Assessment & Plan Note (Signed)
Pneumomediastinum now resolving with treatment of asthma exacerbation Plan No additional imaging is necessary

## 2013-06-21 ENCOUNTER — Inpatient Hospital Stay: Payer: Managed Care, Other (non HMO) | Admitting: Adult Health

## 2013-06-22 ENCOUNTER — Institutional Professional Consult (permissible substitution): Payer: Managed Care, Other (non HMO) | Admitting: Critical Care Medicine

## 2013-08-17 ENCOUNTER — Ambulatory Visit: Payer: Managed Care, Other (non HMO) | Admitting: Critical Care Medicine

## 2013-09-06 ENCOUNTER — Ambulatory Visit: Payer: Managed Care, Other (non HMO) | Admitting: Critical Care Medicine

## 2013-09-09 ENCOUNTER — Ambulatory Visit: Payer: Managed Care, Other (non HMO) | Admitting: Critical Care Medicine

## 2014-01-04 ENCOUNTER — Telehealth: Payer: Self-pay

## 2014-01-04 NOTE — Telephone Encounter (Signed)
Pt was not seen here. He called here in error.

## 2014-01-04 NOTE — Telephone Encounter (Signed)
Patient is calling to see if his lab results were in [phone]

## 2014-01-06 ENCOUNTER — Ambulatory Visit: Payer: Managed Care, Other (non HMO) | Admitting: Emergency Medicine

## 2014-01-06 VITALS — BP 110/70 | HR 70 | Temp 98.1°F | Resp 16 | Ht 72.0 in | Wt 206.0 lb

## 2014-01-06 DIAGNOSIS — H1030 Unspecified acute conjunctivitis, unspecified eye: Secondary | ICD-10-CM

## 2014-01-06 DIAGNOSIS — N3000 Acute cystitis without hematuria: Secondary | ICD-10-CM

## 2014-01-06 DIAGNOSIS — R300 Dysuria: Secondary | ICD-10-CM

## 2014-01-06 LAB — POCT UA - MICROSCOPIC ONLY
Bacteria, U Microscopic: NEGATIVE
CASTS, UR, LPF, POC: NEGATIVE
CRYSTALS, UR, HPF, POC: NEGATIVE
Epithelial cells, urine per micros: NEGATIVE
Mucus, UA: NEGATIVE
RBC, URINE, MICROSCOPIC: NEGATIVE

## 2014-01-06 LAB — POCT URINALYSIS DIPSTICK
Bilirubin, UA: NEGATIVE
GLUCOSE UA: NEGATIVE
Ketones, UA: NEGATIVE
LEUKOCYTES UA: NEGATIVE
NITRITE UA: NEGATIVE
Protein, UA: NEGATIVE
RBC UA: NEGATIVE
Spec Grav, UA: 1.02
UROBILINOGEN UA: 0.2
pH, UA: 6

## 2014-01-06 MED ORDER — OFLOXACIN 0.3 % OP SOLN: 2.0000 [drp] | Freq: Four times a day (QID) | OPHTHALMIC | Status: DC

## 2014-01-06 MED ORDER — SULFAMETHOXAZOLE-TMP DS 800-160 MG PO TABS: 1.0000 | ORAL_TABLET | Freq: Two times a day (BID) | ORAL | Status: DC

## 2014-01-06 NOTE — Patient Instructions (Signed)

## 2014-01-06 NOTE — Progress Notes (Signed)
[facility] [address], [place] [id number] [phone]  Date:  01/06/2014   Name:  Preston HoughKalyn Marquez   DOB:  07/18/1991   MRN:  [id number]  PCP:  Pcp Not In System    Chief Complaint: Dysuria and Eye Problem   History of Present Illness:  Preston Marquez is a 23 y.o. very pleasant male patient who presents with the following:  Treated at Dr Charlane [facility]ers office on Monday for UTI and STD exposure.  Has persistent dysuria.  Says STD tests negative.   Treated with Cipro for diagnosed UTI  No fever or chills.  No nausea or vomiting.  No stool change. Has bilateral eye swelling and redness, no fever.  No foreign body sensation. No visual symptoms.  Started in right eye and now in both.  No improvement with over the counter medications or other home remedies. Denies other complaint or health concern today.   Patient Active Problem List   Diagnosis Date Noted  . Pneumomediastinum 06/11/2013  . Asthma exacerbation 06/11/2013    Past Medical History  Diagnosis Date  . Asthma     Past Surgical History  Procedure Laterality Date  . Lipoma excision      History  Substance Use Topics  . Smoking status: Never Smoker   . Smokeless tobacco: Never Used  . Alcohol Use: Yes     Comment: occasionally    History reviewed. No pertinent family history.  Allergies  Allergen Reactions  . Singulair [Montelukast Sodium] Hives    Medication list has been reviewed and updated.  Current Outpatient Prescriptions on File Prior to Visit  Medication Sig Dispense Refill  . albuterol (PROVENTIL HFA;VENTOLIN HFA) 108 (90 BASE) MCG/ACT inhaler Inhale 2 puffs into the lungs every 6 (six) hours as needed. For wheeze or shortness of breath      . fluticasone (FLONASE) 50 MCG/ACT nasal spray Place 2 sprays into the nose daily as needed for rhinitis.  16 g  6  . fluticasone (FLOVENT HFA) 110 MCG/ACT inhaler Inhale 2 puffs into the lungs 2 (two) times daily.  1 Inhaler  12  . loratadine  (CLARITIN) 10 MG tablet Take 1 tablet (10 mg total) by mouth daily as needed for allergies (nasal congestion).       No current facility-administered medications on file prior to visit.    Review of Systems:  As per HPI, otherwise negative.    Physical Examination: Filed Vitals:   01/06/14 0934  BP: 110/70  Pulse: 70  Temp: 98.1 F (36.7 C)  Resp: 16   Filed Vitals:   01/06/14 0934  Height: 6' (1.829 m)  Weight: 206 lb (93.441 kg)   Body mass index is 27.93 kg/(m^2). Ideal Body Weight: Weight in (lb) to have BMI = 25: 183.9   GEN: WDWN, NAD, Non-toxic, Alert & Oriented x 3 HEENT: Atraumatic, Normocephalic.  Ears and Nose: No external deformity. EXTR: No clubbing/cyanosis/edema NEURO: Normal gait.  PSYCH: Normally interactive. Conversant. Not depressed or anxious appearing.  Calm demeanor.  EYES:  Injected bilaterally.  Cornea clear.  No FB.  Some chemosis  Assessment and Plan: Conjunctivitis, bilateral ocufloxin Cystitis Septra   Signed,  Phillips OdorJeffery Katrina Brosh, MD   Results for orders placed in visit on 01/06/14  POCT URINALYSIS DIPSTICK      Result Value Ref Range   Color, UA yellow     Clarity, UA clear     Glucose, UA neg     Bilirubin, UA neg  Ketones, UA neg     Spec Grav, UA 1.020     Blood, UA neg     pH, UA 6.0     Protein, UA neg     Urobilinogen, UA 0.2     Nitrite, UA neg     Leukocytes, UA Negative    POCT UA - MICROSCOPIC ONLY      Result Value Ref Range   WBC, Ur, HPF, POC 0-1     RBC, urine, microscopic neg     Bacteria, U Microscopic neg     Mucus, UA neg     Epithelial cells, urine per micros neg     Crystals, Ur, HPF, POC neg     Casts, Ur, LPF, POC neg     Yeast, UA

## 2014-06-17 ENCOUNTER — Ambulatory Visit (INDEPENDENT_AMBULATORY_CARE_PROVIDER_SITE_OTHER): Payer: Managed Care, Other (non HMO) | Admitting: Internal Medicine

## 2014-06-17 VITALS — BP 158/80 | HR 62 | Temp 97.7°F | Resp 16 | Ht 71.0 in | Wt 191.0 lb

## 2014-06-17 DIAGNOSIS — I159 Secondary hypertension, unspecified: Secondary | ICD-10-CM

## 2014-06-17 DIAGNOSIS — IMO0002 Reserved for concepts with insufficient information to code with codable children: Secondary | ICD-10-CM

## 2014-06-17 DIAGNOSIS — L0291 Cutaneous abscess, unspecified: Secondary | ICD-10-CM

## 2014-06-17 DIAGNOSIS — I158 Other secondary hypertension: Secondary | ICD-10-CM

## 2014-06-17 DIAGNOSIS — L0390 Cellulitis, unspecified: Secondary | ICD-10-CM

## 2014-06-17 MED ORDER — SULFAMETHOXAZOLE-TMP DS 800-160 MG PO TABS: 1.0000 | ORAL_TABLET | Freq: Two times a day (BID) | ORAL | Status: DC

## 2014-06-17 NOTE — Progress Notes (Signed)
Subjective:  This chart was scribed for Preston Sia, MD by Carl Best, Medical Scribe. This patient was seen in Room 2 and the patient's care was started at 9:03 AM.   Patient ID: Preston Marquez, male    DOB: 08/30/1991, 23 y.o.   MRN: [id number]  HPI HPI Comments: Preston Marquez is a 23 y.o. male who [facility] complaining of an infected mass on his left elbow and left knee.  A week ago he noticed a sore in his mouth a week ago and felt like the roof of his mouth was sore.  He lists chills and subjective fever as associated symptoms.  There are no changes in his ability to taste nor does he experience irritation when swallowing.  He had a boil a year ago but has never experienced these symptoms in the past.    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 Asthma    Past Surgical History  Procedure Laterality Date  . Lipoma excision     History reviewed. No pertinent family history. History   Social History  . Marital Status: Single    Spouse Name: N/A    Number of Children: N/A  . Years of Education: N/A   Occupational History  . Not on file.   Social History Main Topics  . Smoking status: Never Smoker   . Smokeless tobacco: Never Used  . Alcohol Use: Yes     Comment: occasionally  . Drug Use: No  . Sexual Activity: Not on file   Other Topics Concern  . Not on file   Social History Narrative  . No narrative on file   Allergies  Allergen Reactions  . Singulair [Montelukast Sodium] Hives    Review of Systems  Constitutional: Positive for fever and chills.     Objective:  Physical Exam  Nursing note and vitals reviewed. Constitutional: He is oriented to person, place, and time. He appears well-developed and well-nourished. No distress.  HENT:  Head: Normocephalic and atraumatic.  Mouth/Throat: Oropharynx is clear and moist. No oropharyngeal exudate.  There is a  small area with punctate red lesions.  The upper buckle mucosa like a resolving ulcer.  Eyes: Conjunctivae and EOM are normal. Pupils are equal, round, and reactive to light.  Neck: Neck supple. No thyromegaly present.  Cardiovascular: Normal rate.   Pulmonary/Chest: Effort normal.  Lymphadenopathy:    He has no cervical adenopathy.  Neurological: He is alert and oriented to person, place, and time. No cranial nerve deficit.  Skin:  There is a 1 cm pustular lesion on the posterior aspect of the left upper forearm.  Opened with 18 gauged needle and cultured.  A smaller lesion on the left knee without pus.  Psychiatric: He has a normal mood and affect. His behavior is normal.     BP 158/80  Pulse 62  Temp(Src) 97.7 F (36.5 C) (Oral)  Resp 16  Ht  (1.803 m)  Wt 191 lb (86.637 kg)  BMI 26.65 kg/m2  SpO2 100% Assessment & Plan:  Abscess - Plan: Wound culture  Cellulitis and abscess of upper arm and forearm - Plan: Wound culture  Elevated BP w/out dx HTN--needs outside BPs  Meds ordered this encounter  Medications  . sulfamethoxazole-trimethoprim (BACTRIM DS) 800-160 MG per tablet    Sig: Take 1 tablet by mouth 2 (two) times daily.    Dispense:  20 tablet    Refill:  0     I personally performed the services described in this documentation, which was scribed in my presence. The recorded information has been reviewed and is accurate.

## 2014-06-19 LAB — WOUND CULTURE
GRAM STAIN: NONE SEEN
GRAM STAIN: NONE SEEN

## 2014-07-02 IMAGING — CR DG CHEST 2V
2 series · 2 of 2 positions shown · non-contrast
Comparison: 06/14/2013

CLINICAL DATA: Asthma, follow up pneumomediastinum

CHEST - 2 VIEW

[w chest pa]
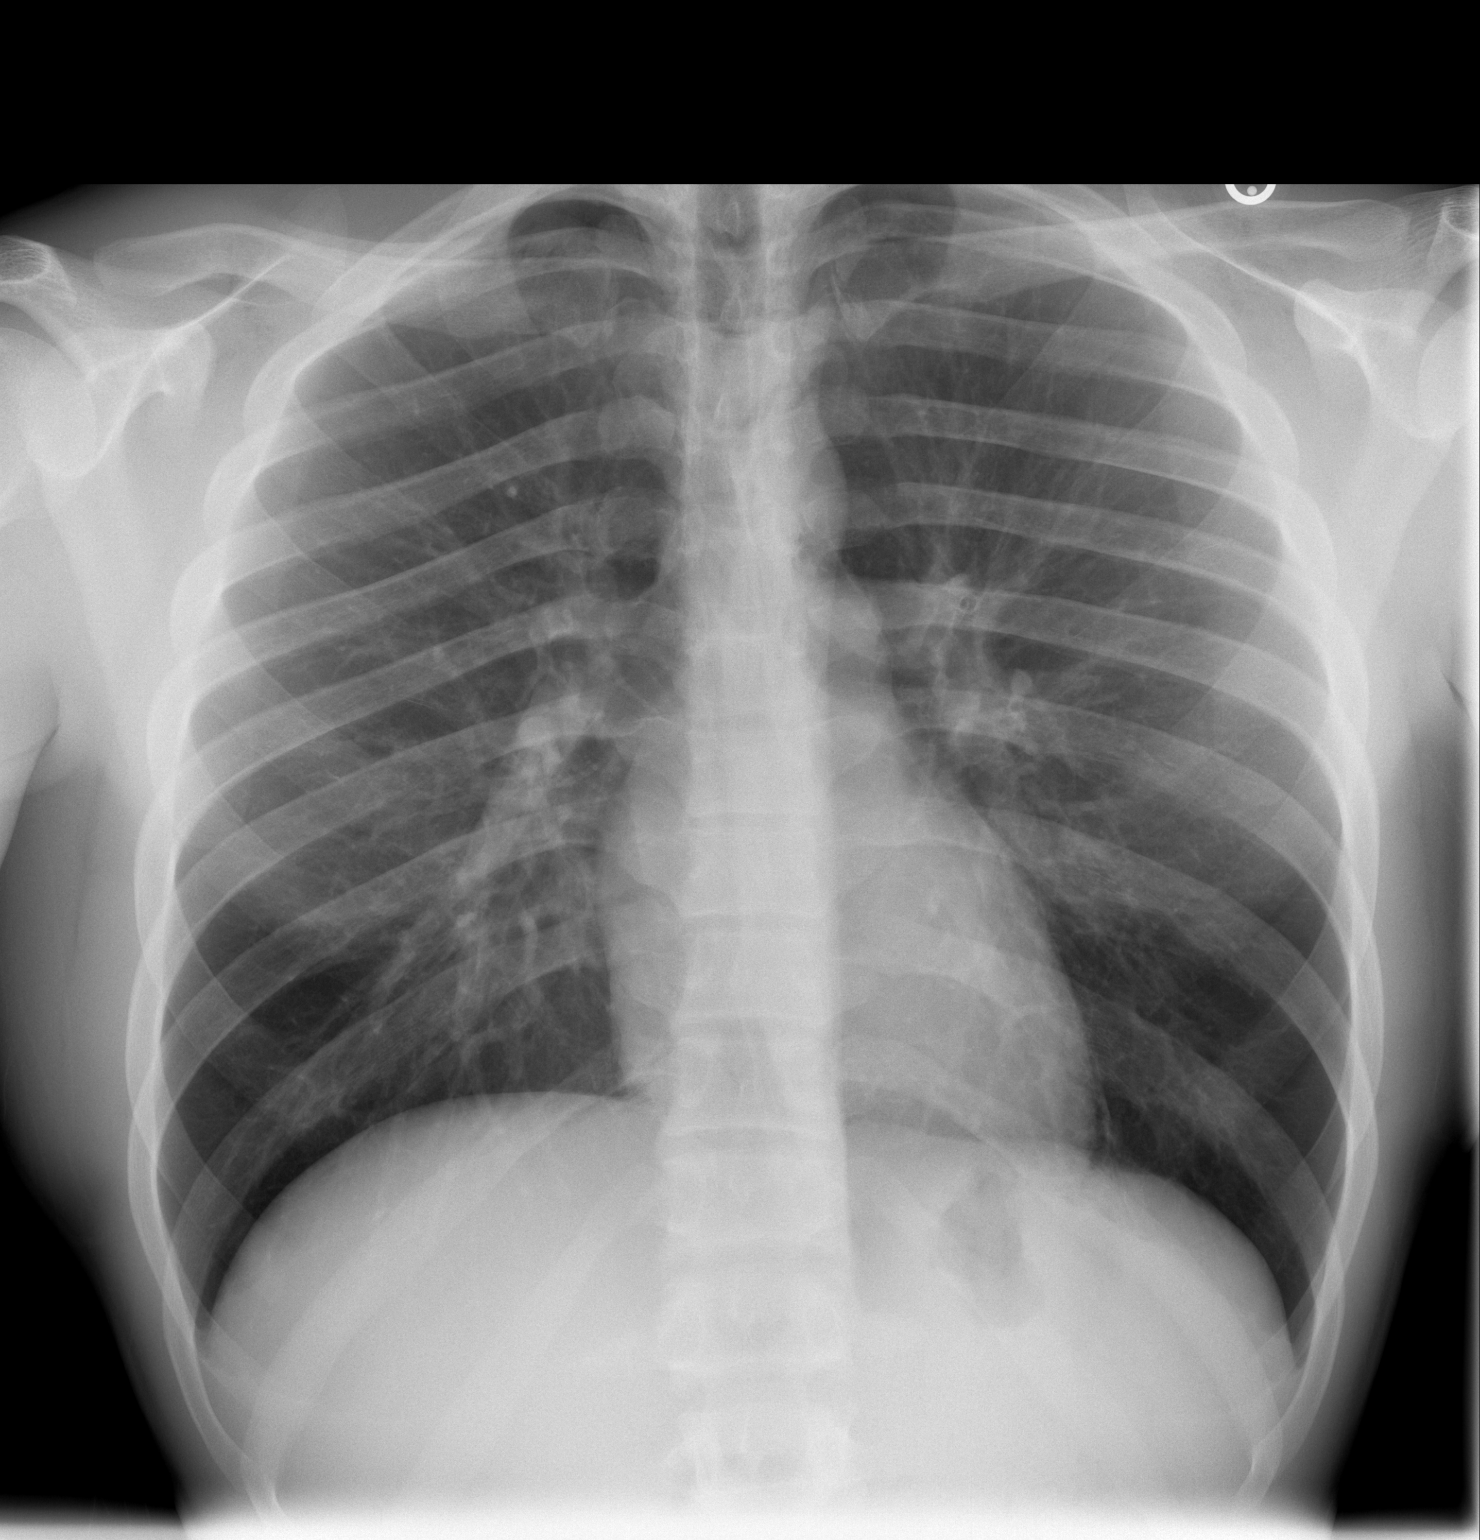

[w chest lat]
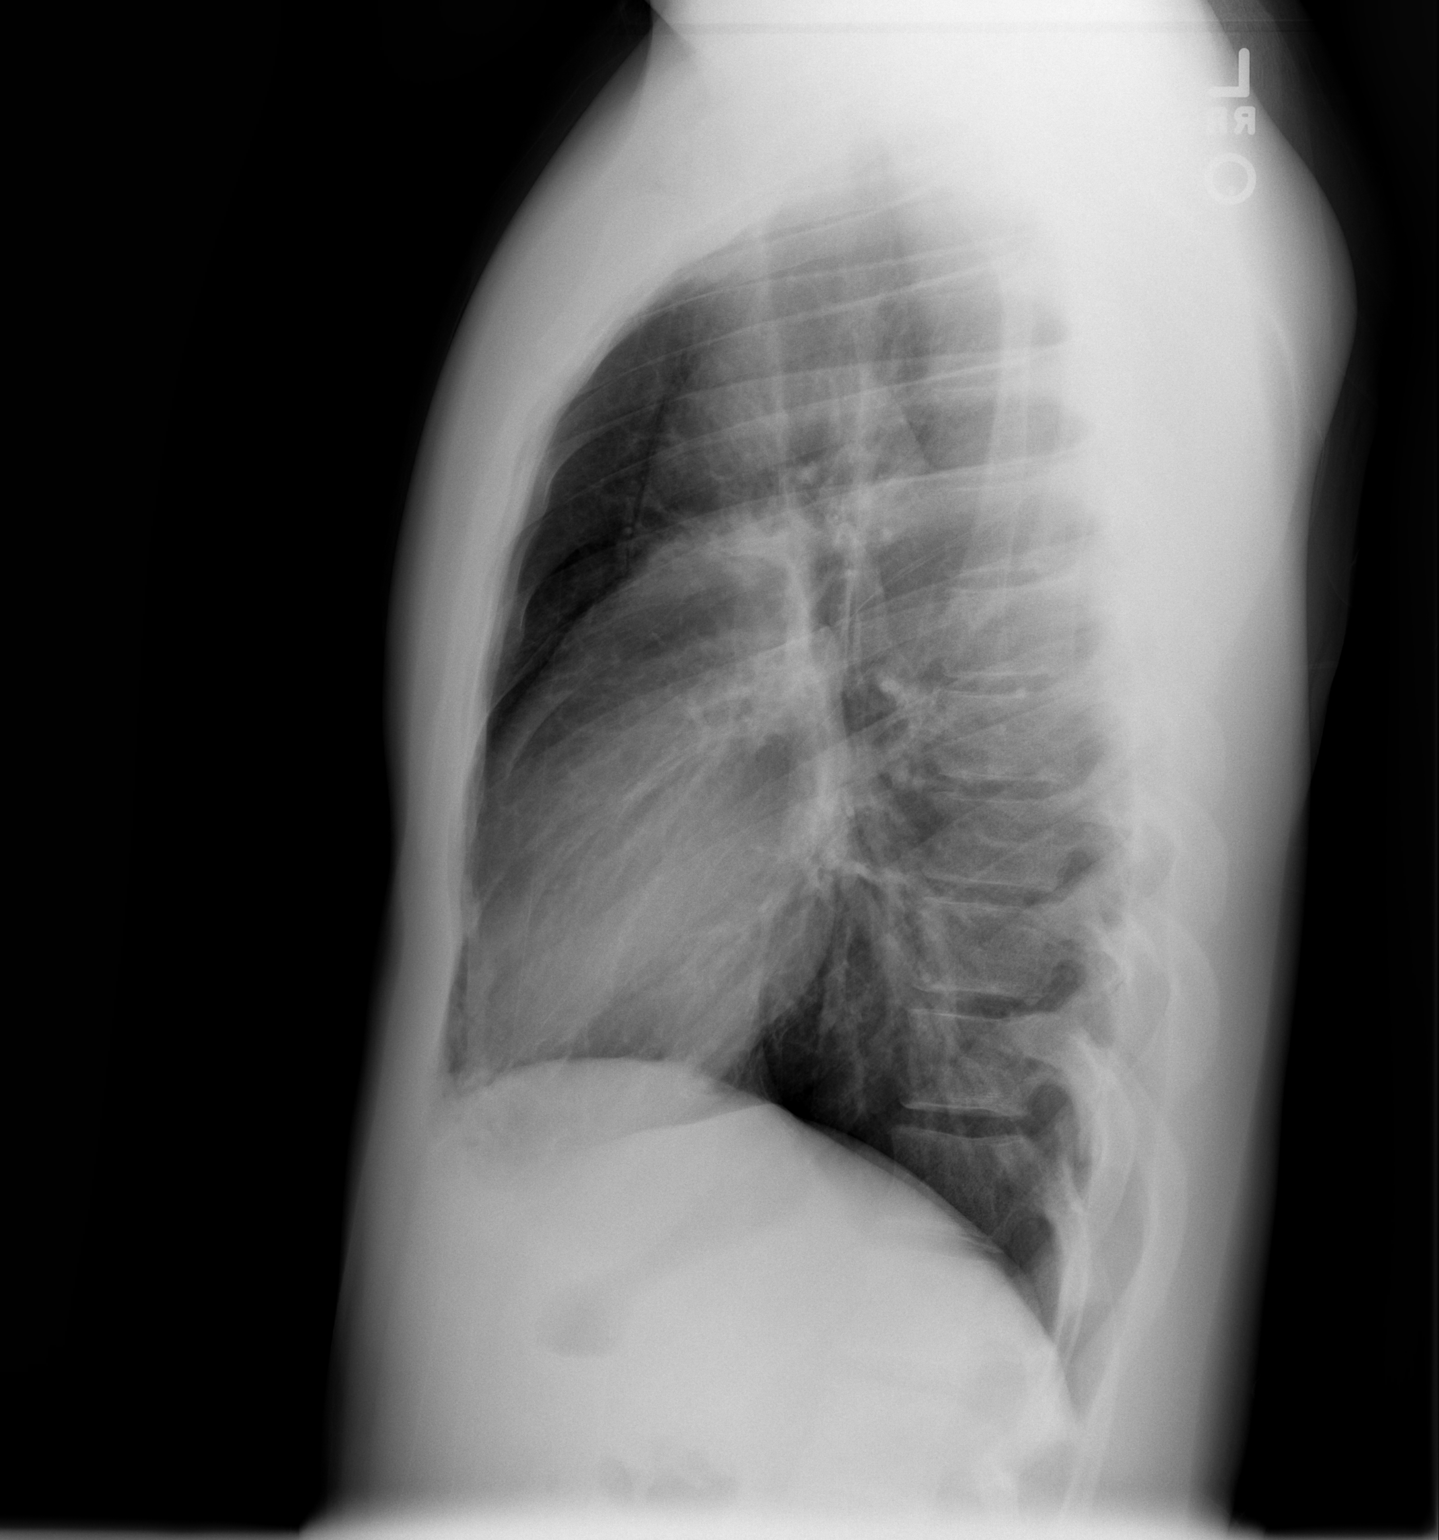

[2 of 2 positions shown; findings below may reference images not displayed]

FINDINGS: Cardiomediastinal silhouette is stable.  No acute
infiltrate or pulmonary edema.  Small residual pneumomediastinum
with improvement from prior exam.  Significant improvement in
subcutaneous emphysema upper chest wall.  No diagnostic
pneumothorax.
IMPRESSION: No acute infiltrate or pulmonary edema.  Small residual]
pneumomediastinum with improvement from prior exam.  Significant
improvement in subcutaneous emphysema upper chest wall.  No
diagnostic pneumothorax.

## 2014-11-20 ENCOUNTER — Ambulatory Visit (INDEPENDENT_AMBULATORY_CARE_PROVIDER_SITE_OTHER): Payer: BLUE CROSS/BLUE SHIELD

## 2014-11-20 ENCOUNTER — Ambulatory Visit (INDEPENDENT_AMBULATORY_CARE_PROVIDER_SITE_OTHER): Payer: BLUE CROSS/BLUE SHIELD | Admitting: Family Medicine

## 2014-11-20 VITALS — BP 108/60 | HR 101 | Temp 98.1°F | Resp 16 | Ht 72.0 in | Wt 225.0 lb

## 2014-11-20 DIAGNOSIS — M25561 Pain in right knee: Secondary | ICD-10-CM | POA: Diagnosis not present

## 2014-11-20 DIAGNOSIS — S8991XA Unspecified injury of right lower leg, initial encounter: Secondary | ICD-10-CM

## 2014-11-20 DIAGNOSIS — T148 Other injury of unspecified body region: Secondary | ICD-10-CM | POA: Diagnosis not present

## 2014-11-20 DIAGNOSIS — T148XXA Other injury of unspecified body region, initial encounter: Secondary | ICD-10-CM

## 2014-11-20 MED ORDER — MELOXICAM 7.5 MG PO TABS: 7.5000 mg | ORAL_TABLET | Freq: Two times a day (BID) | ORAL | Status: AC
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
## 2014-12-05 ENCOUNTER — Other Ambulatory Visit: Payer: Self-pay | Admitting: Orthopedic Surgery

## 2014-12-05 DIAGNOSIS — M25561 Pain in right knee: Secondary | ICD-10-CM

## 2014-12-13 ENCOUNTER — Ambulatory Visit
Admission: RE | Admit: 2014-12-13 | Discharge: 2014-12-13 | Disposition: A | Discharge status: Home / Self Care | Payer: BLUE CROSS/BLUE SHIELD | Source: Ambulatory Visit | Attending: Orthopedic Surgery | Admitting: Orthopedic Surgery

## 2014-12-13 DIAGNOSIS — M25561 Pain in right knee: Secondary | ICD-10-CM

## 2015-02-23 ENCOUNTER — Telehealth: Payer: Self-pay | Admitting: Critical Care Medicine

## 2015-02-23 MED ORDER — ALBUTEROL SULFATE HFA 108 (90 BASE) MCG/ACT IN AERS: 1.0000 | INHALATION_SPRAY | Freq: Four times a day (QID) | RESPIRATORY_TRACT | Status: DC | PRN

## 2015-02-23 NOTE — Telephone Encounter (Signed)
Spoke with pt. States that he has missed placed his Albuterol HFA. Needs this refilled. ROV has been made with PW for 02/26/15. Advised pt that I would refill this for him but he had to come to this appointment on Monday. He agreed and verbalized understanding. Nothing further was needed.

## 2015-02-26 ENCOUNTER — Ambulatory Visit: Payer: BLUE CROSS/BLUE SHIELD | Admitting: Critical Care Medicine

## 2015-04-11 ENCOUNTER — Ambulatory Visit (INDEPENDENT_AMBULATORY_CARE_PROVIDER_SITE_OTHER): Payer: BLUE CROSS/BLUE SHIELD | Admitting: Emergency Medicine

## 2015-04-11 VITALS — BP 124/70 | HR 65 | Temp 97.4°F | Resp 17 | Ht 71.0 in | Wt 196.4 lb

## 2015-04-11 DIAGNOSIS — Z13220 Encounter for screening for lipoid disorders: Secondary | ICD-10-CM

## 2015-04-11 DIAGNOSIS — Z0000 Encounter for general adult medical examination without abnormal findings: Secondary | ICD-10-CM

## 2015-04-11 DIAGNOSIS — Z113 Encounter for screening for infections with a predominantly sexual mode of transmission: Secondary | ICD-10-CM

## 2015-04-11 LAB — POCT CBC
Granulocyte percent: 41.3 %G (ref 37–80)
HCT, POC: 42.5 % — AB (ref 43.5–53.7)
Hemoglobin: 13.6 g/dL — AB (ref 14.1–18.1)
Lymph, poc: 1.7 (ref 0.6–3.4)
MCH: 25.8 pg — AB (ref 27–31.2)
MCHC: 32.1 g/dL (ref 31.8–35.4)
MCV: 80.4 fL (ref 80–97)
MID (cbc): 0.2 (ref 0–0.9)
MPV: 7.7 fL (ref 0–99.8)
PLATELET COUNT, POC: 212 10*3/uL (ref 142–424)
POC Granulocyte: 1.4 — AB (ref 2–6.9)
POC LYMPH PERCENT: 51.7 %L — AB (ref 10–50)
POC MID %: 7 % (ref 0–12)
RBC: 5.29 M/uL (ref 4.69–6.13)
RDW, POC: 14.3 %
WBC: 3.3 10*3/uL — AB (ref 4.6–10.2)

## 2015-04-11 LAB — COMPLETE METABOLIC PANEL WITH GFR
ALK PHOS: 72 U/L (ref 39–117)
ALT: 12 U/L (ref 0–53)
AST: 34 U/L (ref 0–37)
Albumin: 4.5 g/dL (ref 3.5–5.2)
BILIRUBIN TOTAL: 0.8 mg/dL (ref 0.2–1.2)
BUN: 12 mg/dL (ref 6–23)
CO2: 29 meq/L (ref 19–32)
Calcium: 9.6 mg/dL (ref 8.4–10.5)
Chloride: 101 mEq/L (ref 96–112)
Creat: 1.19 mg/dL (ref 0.50–1.35)
GFR, Est African American: >89 mL/min
GFR, Est Non African American: 85 mL/min
Glucose, Bld: 89 mg/dL (ref 70–99)
Potassium: 4 mEq/L (ref 3.5–5.3)
Sodium: 141 mEq/L (ref 135–145)
TOTAL PROTEIN: 6.6 g/dL (ref 6.0–8.3)

## 2015-04-11 LAB — LIPID PANEL
CHOL/HDL RATIO: 3.9 ratio
Cholesterol: 193 mg/dL (ref 0–200)
HDL: 49 mg/dL (ref >=40)
LDL Cholesterol: 123 mg/dL — ABNORMAL HIGH (ref 0–99)
TRIGLYCERIDES: 103 mg/dL (ref <150)
VLDL: 21 mg/dL (ref 0–40)

## 2015-04-11 MED ORDER — ALBUTEROL SULFATE HFA 108 (90 BASE) MCG/ACT IN AERS: 1.0000 | INHALATION_SPRAY | Freq: Four times a day (QID) | RESPIRATORY_TRACT | Status: AC | PRN

## 2015-04-11 NOTE — Progress Notes (Signed)

## 2015-04-11 NOTE — Progress Notes (Signed)
   Subjective:    Patient ID: Preston Marquez, male    DOB: 08-12-1991, 24 y.o.   MRN: [id number]  HPI    Review of Systems  To Whom It May Concern:  This patient was screened for tuberculosis using current guidelines from the Centers for Disease Control, and does not have tuberculosis in the communicable form.       Objective:   Physical Exam        Assessment & Plan:

## 2015-04-11 NOTE — Progress Notes (Addendum)
Subjective:   This chart was scribed for Earl Lites, MD by Andrew Au, ED Scribe. This patient was seen in room 1 and the patient's care was started at 11:06 AM.   Patient ID: Preston Marquez, male    DOB: 15-Aug-1991, 24 y.o.   MRN: [id number]  HPI  Chief Complaint  Patient presents with  . Annual Exam  . PPD Placement  . Cholesterol Screen   HPI Comments: Preston Marquez is a 24 y.o. male who presents to the [facility] for a physical. Pt is attending law school in the fall and is a former Consulting civil engineer of  [employer] who attended school from 2004-2010. Prior to attending school he would like an annual exam and to make sure his immunizations are UTD. Pt denies medical problems including sickle trait and taking daily medications. Pt works at [employer] and is an Geophysicist/field seismologist to Comptroller. Pt denies being a smoker. He denies recent travels over seas.   Past Medical History  Diagnosis Date  . Asthma    Past Surgical History  Procedure Laterality Date  . Lipoma excision     Prior to Admission medications   Medication Sig Start Date End Date Taking? Authorizing Provider  albuterol (PROAIR HFA) 108 (90 BASE) MCG/ACT inhaler Inhale 1-2 puffs into the lungs every 6 (six) hours as needed for wheezing or shortness of breath. 02/23/15  Yes Storm Frisk, MD  meloxicam (MOBIC) 7.5 MG tablet Take 1 tablet (7.5 mg total) by mouth 2 (two) times daily with a meal. No other NSAIDs,  Take with food. Patient not taking: Reported on 04/11/2015 11/20/14   Lenell Antu, DO   Review of Systems  All other systems reviewed and are negative.    Objective:   Physical Exam CONSTITUTIONAL: Well developed/well nourished HEAD: Normocephalic/atraumatic EYES: EOMI/PERRL ENMT: Mucous membranes moist NECK: supple no meningeal signs SPINE/BACK:entire spine nontender CV: S1/S2 noted, no murmurs/rubs/gallops noted LUNGS: Lungs are clear to auscultation bilaterally, no apparent distress ABDOMEN:  soft, nontender, no rebound or guarding, bowel sounds noted throughout abdomen GU:no cva tenderness NEURO: Pt is awake/alert/appropriate, moves all extremitiesx4.  No facial droop.   EXTREMITIES: pulses normal/equal, full ROM SKIN: warm, color normal. 5x5 mm mole on arm which has been partially avulsed from scratching.  PSYCH: no abnormalities of mood noted, alert and oriented to situation   Filed Vitals:   04/11/15 1008  BP: 124/70  Pulse: 65  Temp: 97.4 F (36.3 C)  TempSrc: Oral  Resp: 17  Height:  (1.803 m)  Weight: 196 lb 6.4 oz (89.086 kg)  SpO2: 98%   Results for orders placed or performed in visit on 04/11/15  POCT CBC  Result Value Ref Range   WBC 3.3 (A) 4.6 - 10.2 K/uL   Lymph, poc 1.7 0.6 - 3.4   POC LYMPH PERCENT 51.7 (A) 10 - 50 %L   MID (cbc) 0.2 0 - 0.9   POC MID % 7.0 0 - 12 %M   POC Granulocyte 1.4 (A) 2 - 6.9   Granulocyte percent 41.3 37 - 80 %G   RBC 5.29 4.69 - 6.13 M/uL   Hemoglobin 13.6 (A) 14.1 - 18.1 g/dL   HCT, POC 40.9 (A) 81.1 - 53.7 %   MCV 80.4 80 - 97 fL   MCH, POC 25.8 (A) 27 - 31.2 pg   MCHC 32.1 31.8 - 35.4 g/dL   RDW, POC 91.4 %   Platelet Count, POC 212  142 - 424 K/uL   MPV 7.7 0 - 99.8 fL   Assessment & Plan:  Patient is in excellent health with healthy lifestyle. His albuterol inhaler was refilled and routine labs were done including STD checks. I personally performed the services described in this documentation, which was scribed in my presence. The recorded information has been reviewed and is accurate.  Earl Lites, MD

## 2015-04-12 LAB — RPR

## 2015-04-12 LAB — HIV ANTIBODY (ROUTINE TESTING W REFLEX): HIV: NONREACTIVE

## 2015-04-12 LAB — SICKLE CELL SCREEN: Sickle Cell Screen: NEGATIVE

## 2015-04-17 ENCOUNTER — Telehealth: Payer: Self-pay

## 2015-04-17 NOTE — Telephone Encounter (Signed)
Patient faxed over forms to be completed by Dr. Cleta Albertsaub on 04/17/15 for his school records. He would like to be called and told when we send the fax to his school and if we got a conformation. The fax number is [phone]  His call back number is [phone]

## 2015-04-17 NOTE — Telephone Encounter (Signed)
Form in your box

## 2018-12-07 ENCOUNTER — Emergency Department: Payer: Self-pay

## 2018-12-07 ENCOUNTER — Emergency Department
Admission: EM | Admit: 2018-12-07 | Discharge: 2018-12-07 | Disposition: A | Discharge status: Home / Self Care | Payer: Self-pay | Attending: Emergency Medical Services | Admitting: Emergency Medical Services

## 2018-12-07 DIAGNOSIS — M25561 Pain in right knee: Secondary | ICD-10-CM | POA: Insufficient documentation

## 2018-12-07 DIAGNOSIS — M25562 Pain in left knee: Secondary | ICD-10-CM | POA: Insufficient documentation

## 2018-12-07 DIAGNOSIS — S00511A Abrasion of lip, initial encounter: Secondary | ICD-10-CM | POA: Insufficient documentation

## 2018-12-07 HISTORY — DX: Unspecified asthma, uncomplicated: J45.909

## 2018-12-07 NOTE — ED Notes (Signed)
Bed: H 6  Expected date:   Expected time:   Means of arrival:   Comments:  Medic 433
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
## 2018-12-07 NOTE — ED Notes (Signed)
Pt verbalized understanding of dci, 0 rx, and f/u. Denies any further questions. No IV access. Ambulatory on own with steady gait noted.

## 2018-12-07 NOTE — Discharge Instructions (Signed)
Dear Mr. Jerome Moody:    I appreciate your choosing the [facility] for your healthcare needs, and hope your visit today was EXCELLENT.    Instructions:  Please follow-up with your family doctor as needed.    Return to the Emergency Department for any worsening symptoms or concerns.    Below is some information that our patients often find helpful.    We wish you good health and please do not hesitate to contact us if we can ever be of any assistance.    Sincerely,  Harden Mo, MD  [facility]    ________________________________________________________________    If you do not continue to improve or your condition worsens, please contact your doctor or return immediately to the Emergency Department.    Thank you for choosing [facility] for your emergency care needs.  We strive to provide EXCELLENT care to you and your family.      DOCTOR REFERRALS  Call [phone] if you need any further referrals and we can help you find a primary care doctor or specialist.  Also, available online at:  [email or web address]    YOUR CONTACT INFORMATION  Before leaving please check with registration to make sure we have an up-to-date contact number.  You can call registration at [phone] to update your information.  For questions about your hospital bill, please call [phone].  For questions about your Emergency Dept Physician bill please call [phone].      FREE HEALTH SERVICES  If you need help with health or social services, please call 2-1-1 for a free referral to resources in your area.  2-1-1 is a free service connecting people with information on health insurance, free clinics, pregnancy, mental health, dental care, food assistance, housing, and substance abuse counseling.  Also, available online at:  http://www.[facility].org    MEDICAL RECORDS AND TESTS  Certain laboratory test results do not come back the same day, for  example urine cultures.   We will contact you if other important findings are noted.  Radiology films are often reviewed again to ensure accuracy.  If there is any discrepancy, we will notify you.      Please call [phone] to pick up a complimentary CD of any radiology studies performed.  If you or your doctor would like to request a copy of your medical records, please call [phone].      ORTHOPEDIC INJURY   Please know that significant injuries can exist even when an initial x-ray is read as normal or negative.  This can occur because some fractures (broken bones) are not initially visible on x-rays.  For this reason, close outpatient follow-up with your primary care doctor or bone specialist (orthopedist) is required.    MEDICATIONS AND FOLLOWUP  Please be aware that some prescription medications can cause drowsiness.  Use caution when driving or operating machinery.    The examination and treatment you have received in our Emergency Department is provided on an emergency basis, and is not intended to be a substitute for your primary care physician.  It is important that your doctor checks you again and that you report any new or remaining problems at that time.      24 HOUR PHARMACIES  [facility] [address], [place], [place] [address] (1.4 miles, 7 minutes)  [facility] - [address] [place], [place] [address] (6.5 miles, 13 minutes)  Handout with directions available on request  PATIENT RELATIONS  If you have any concerns, issues, or feedback related to your care, positive or negative, please do not hesitate to contact Patient Relations at [phone]. They are open from 8:30AM-5:00PM Monday through Friday.
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
## 2018-12-08 ENCOUNTER — Emergency Department: Payer: Self-pay

## 2018-12-08 ENCOUNTER — Emergency Department
Admission: EM | Admit: 2018-12-08 | Discharge: 2018-12-08 | Disposition: A | Discharge status: Home / Self Care | Payer: Self-pay | Attending: Emergency Medicine | Admitting: Emergency Medicine

## 2018-12-08 DIAGNOSIS — S060X0A Concussion without loss of consciousness, initial encounter: Secondary | ICD-10-CM | POA: Insufficient documentation

## 2018-12-08 DIAGNOSIS — S161XXA Strain of muscle, fascia and tendon at neck level, initial encounter: Secondary | ICD-10-CM | POA: Insufficient documentation

## 2018-12-08 MED ORDER — CYCLOBENZAPRINE HCL 10 MG PO TABS
10.0000 mg | ORAL_TABLET | Freq: Once | ORAL | Status: AC
Start: 2018-12-08 — End: 2018-12-08
  Administered 2018-12-08: 21:00:00 10 mg via ORAL
  Filled 2018-12-08: qty 1

## 2018-12-08 MED ORDER — CYCLOBENZAPRINE HCL 10 MG PO TABS
10.0000 mg | ORAL_TABLET | Freq: Three times a day (TID) | ORAL | 0 refills | Status: DC | PRN
Start: 2018-12-08 — End: 2018-12-08

## 2018-12-08 MED ORDER — IBUPROFEN 600 MG PO TABS
600.0000 mg | ORAL_TABLET | Freq: Once | ORAL | Status: AC
Start: 2018-12-08 — End: 2018-12-08
  Administered 2018-12-08: 21:00:00 600 mg via ORAL
  Filled 2018-12-08: qty 1

## 2018-12-08 MED ORDER — IBUPROFEN 600 MG PO TABS
600.0000 mg | ORAL_TABLET | Freq: Four times a day (QID) | ORAL | 0 refills | Status: DC | PRN
Start: 2018-12-08 — End: 2018-12-08

## 2018-12-08 MED ORDER — IBUPROFEN 600 MG PO TABS
600.00 mg | ORAL_TABLET | Freq: Four times a day (QID) | ORAL | 0 refills | Status: AC | PRN
Start: 2018-12-08 — End: ?

## 2018-12-08 MED ORDER — ALBUTEROL SULFATE HFA 108 (90 BASE) MCG/ACT IN AERS
2.0000 | INHALATION_SPRAY | RESPIRATORY_TRACT | 0 refills | Status: DC | PRN
Start: 2018-12-08 — End: 2018-12-08

## 2018-12-08 MED ORDER — CYCLOBENZAPRINE HCL 10 MG PO TABS
10.00 mg | ORAL_TABLET | Freq: Three times a day (TID) | ORAL | 0 refills | Status: AC | PRN
Start: 2018-12-08 — End: 2018-12-23

## 2018-12-08 MED ORDER — ALBUTEROL SULFATE HFA 108 (90 BASE) MCG/ACT IN AERS
2.0000 | INHALATION_SPRAY | RESPIRATORY_TRACT | 0 refills | Status: AC | PRN
Start: 2018-12-08 — End: 2019-01-07
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
## 2018-12-08 NOTE — Discharge Instructions (Signed)
Dear Jerome Moody,    You were seen today by Josph Macho, PA-C. Thank you for choosing the [facility] for your healthcare needs.  We hope your visit today was EXCELLENT.    HEAT OR ICE EXTREMITY MULTIPLE TIMES PER DAY.  TAKE IBUPROFEN EVERY 6-8 HOURS, WITH FOOD, AS NEEDED FOR PAIN.  TAKE FLEXERIL UP TO THREE TIMES DAILY, AS NEEDED FOR MUSCLE SPASMS, THIS CAN ALSO CAUSE DROWSINESS. DO NOT COMBINE WITH NARCOTIC PAIN MEDICATIONS.  FOLLOW UP WITH YOUR PRIMARY DOCTOR AND ORTHOPEDIC REFERRAL IN 1-2 DAYS FOR REPEAT EVALUATION.  RETURN TO THE ER IF YOU DEVELOP WORSENING SYMPTOMS, SEVERE PAIN, REDNESS, SWELLING, NUMBNESS, DISCOLORATION, LOSS OF FUNCTION, OR NEW CONCERNING SYMPTOMS.    Please take any medications prescribed as directed.     If you have any questions or concerns, I am available at [phone]. Please do not hesitate to contact me if I can be of assistance.     Below is some information and resources that our patients often find helpful.    Sincerely,    Josph Macho, Physician Assistant  [facility]    ________________________________________________________________  Thank you for choosing [facility] for your emergency care needs.  We strive to provide EXCELLENT care to you and your family.      If you do not continue to improve, your condition worsens, or you develop new concerning symptoms please contact your doctor or return immediately to the Emergency Department.    DOCTOR REFERRALS  Call [phone] (available 24 hours a day, 7 days a week) if you need any further referrals and we can help you find a primary care doctor or specialist.  Also, available online at:  [email or web address]    YOUR CONTACT INFORMATION  Before leaving please check with registration to make sure we have an up-to-date contact number.  You can call registration at [phone] to update your information.  For questions about  your hospital bill, please call [phone].  For questions about your Emergency Dept Physician bill please call [phone].      FREE HEALTH SERVICES  If you need help with health or social services, please call 2-1-1 for a free referral to resources in your area.  2-1-1 is a free service connecting people with information on health insurance, free clinics, pregnancy, mental health, dental care, food assistance, housing, and substance abuse counseling.  Also, available online at:  http://www.[facility].org    MEDICAL RECORDS AND TESTS  Certain laboratory test results do not come back the same day, for example urine cultures.  We will contact you if other important findings are noted. Radiology films are often reviewed again to ensure accuracy.  If there is any discrepancy, we will notify you.    Please call [phone] to pick up a complimentary CD of any radiology studies performed.  If you or your doctor would like to request a copy of your medical records, please call [phone].      ORTHOPEDIC INJURY   Please know that significant injuries can exist even when an initial x-ray is read as normal or negative.  This can occur because some fractures (broken bones) are not initially visible on x-rays or with soft tissue injuries.  For this reason, close outpatient follow-up with your primary care doctor or bone specialist (orthopedist) is required.    MEDICATIONS AND FOLLOWUP  Please be aware that some prescription medications can cause drowsiness.  Use  caution when driving or operating machinery.    The examination and treatment you have received in our Emergency Department is provided on an emergency basis, and is not intended to be a substitute for your primary care physician.  It is important that your doctor checks you again and that you report any new or remaining problems at that time.      24 HOUR PHARMACIES  [facility] [address], [place], [place] [id number] (1.4 miles, 7 minutes)  [facility] - [address], [place], [place] [phone] (6.5 miles, 13 minutes)  Handout with directions available on request.
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
## 2019-06-29 DIAGNOSIS — J45909 Unspecified asthma, uncomplicated: Secondary | ICD-10-CM | POA: Insufficient documentation

## 2021-03-05 ENCOUNTER — Encounter (HOSPITAL_BASED_OUTPATIENT_CLINIC_OR_DEPARTMENT_OTHER): Payer: Self-pay

## 2021-03-05 NOTE — Progress Notes (Signed)
PAA returned pt's call on 03/05/2021, spoke to pt.  PAA advised pt the IPAC center is Walk ins only.  Hours of operation are from 8 am - 4 pm.

## 2021-06-14 ENCOUNTER — Encounter (HOSPITAL_BASED_OUTPATIENT_CLINIC_OR_DEPARTMENT_OTHER): Payer: Self-pay

## 2021-06-14 ENCOUNTER — Ambulatory Visit (HOSPITAL_BASED_OUTPATIENT_CLINIC_OR_DEPARTMENT_OTHER): Payer: Self-pay | Admitting: Psychiatry

## 2021-06-14 ENCOUNTER — Encounter (HOSPITAL_BASED_OUTPATIENT_CLINIC_OR_DEPARTMENT_OTHER): Payer: Self-pay | Admitting: Psychiatry

## 2021-06-14 DIAGNOSIS — F419 Anxiety disorder, unspecified: Secondary | ICD-10-CM

## 2021-06-14 DIAGNOSIS — F32A Depression, unspecified: Secondary | ICD-10-CM

## 2021-06-14 MED ORDER — FLUOXETINE HCL 10 MG PO CAPS
10.00 mg | ORAL_CAPSULE | Freq: Every day | ORAL | 2 refills | Status: DC
Start: 2021-06-14 — End: 2021-09-11
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
## 2021-06-14 NOTE — Patient Instructions (Signed)
MEDICATION INSTRUCTIONS                    Take all medications as prescribed; do not stop medications or change dosages without talking to your provider(s).  Abstain from alcohol and/or illegal drugs as they interfere with psychiatric medications.   Immediately go to the nearest emergency department or call 911 if you have any thoughts of wanting to harm yourself or others, or for any other crisis.  National Suicide Prevention Lifeline: call or text 988 or call 1-800-273-8255.   Consult with your pharmacist if you questions about your medications, their side effects or possible interactions with other medications you take.      FOLLOW-UP CARE APPOINTMENTS    PSYCHIATRIC MEDICATION MANAGEMENT: Will secure an outpatient mental health provider    PSYCHOTHERAPY: If you do not already have a therapist, find one by contact your insurance provider for a list of in-network providers. Time-limited, psychotherapy programs are available through [place] by calling [phone]. Another source for therapists is http://www.psychology-today.com/.  Return to IPAC if necessary (but not for routine matters like refills). IPAC hours are 8a-4pm Monday thru Friday, excluding Major Holidays. IPAC will see patients on walk-in basis from 01/21/21.       ABOUT YOUR MEDICATIONS:            [place] [facility]    IBH Call Center  - [phone] 24/7/365  For admissions and screening for all [facility] Services, including:  Comprehensive Addiction Treatment Services (CATS) Inpatient Detox, IOP Intensive Outpatient Programs  Partial Hospitalization Program (PHP), Outpatient psychiatry, Outpatient counseling        IPAC  [facility]  [address]  [place], [place] [address] For Urgent Adult (18 and Over) Psychiatric Assessments [phone]     [facility]  [address]  [place], [place] [address]   For Child and Youth (Under 18) Mental Health and Substance Abuse Outpatient  Services [phone]   [facility] [place]  [address]  [place], [place] [address] For Non-Urgent Psychiatric Appointments: [phone]   [facility]-   [place]  [address]  [place], [place] [address]   For Non-Urgent Psychiatric Appointments: [phone]   [facility] [place]  [address]  [place], [place] [address]   For Non-Urgent Psychiatric Appointments: [phone]   [facility] [place]  [address]  [place], [place] [address]   For Non-Urgent Psychiatric Appointments: [phone]   [facility] [place]  [address]   [place], [place] [address]   For Non-Urgent Psychiatric Appointments: [phone]         COMMUNITY RESOURCES (MENTAL HEALTH CENTERS):      [facility]  Entry and Referral Services [phone]     [facility], [place], [place] [phone]   [facility], [place], [place] [phone]     [facility], [place], [place] [phone]     [place]     [facility], [place], [place] [phone]     [place]    [facility], [place], [place]  [phone]       [place]    [facility], [place], [place] [phone]     [place]    [facility], [place], [place]  [phone]       [place]      [place] [facility]  ([place]) - [phone]              [place] - [phone]

## 2021-06-14 NOTE — Progress Notes (Signed)
Wt spoke with pt who reported [facility] for his fluoxetine and will not fill his script. [facility] Daralene Milch

## 2021-06-17 ENCOUNTER — Encounter (HOSPITAL_BASED_OUTPATIENT_CLINIC_OR_DEPARTMENT_OTHER): Payer: Self-pay

## 2021-06-17 NOTE — Progress Notes (Signed)
Wt attempted to f/u with pt about call on Friday regarding script not going through. Wt called listed phone number of [phone]. No answer. No opt to LVM

## 2021-09-11 ENCOUNTER — Encounter (HOSPITAL_BASED_OUTPATIENT_CLINIC_OR_DEPARTMENT_OTHER): Payer: Self-pay

## 2021-09-11 ENCOUNTER — Ambulatory Visit (HOSPITAL_BASED_OUTPATIENT_CLINIC_OR_DEPARTMENT_OTHER): Payer: Self-pay | Admitting: Psychiatry

## 2021-09-11 VITALS — BP 135/76 | HR 82 | Temp 97.0°F | Ht 72.0 in | Wt 210.0 lb

## 2021-09-11 DIAGNOSIS — F4320 Adjustment disorder, unspecified: Secondary | ICD-10-CM

## 2021-09-11 MED ORDER — FLUOXETINE HCL 20 MG PO CAPS
20.00 mg | ORAL_CAPSULE | Freq: Every day | ORAL | 2 refills | Status: AC
Start: 2021-09-11 — End: 2022-09-11

## 2021-09-11 NOTE — Patient Instructions (Signed)
MEDICATION INSTRUCTIONS                    Take all medications as prescribed; do not stop medications or change dosages without talking to your provider(s).  Abstain from alcohol and/or illegal drugs as they interfere with psychiatric medications.   Immediately go to the nearest emergency department or call 911 if you have any thoughts of wanting to harm yourself or others, or for any other crisis.  National Suicide Prevention Lifeline: call or text 988 or call (507)839-7973.   Consult with your pharmacist if you questions about your medications, their side effects or possible interactions with other medications you take.      FOLLOW-UP CARE APPOINTMENTS    PSYCHIATRIC MEDICATION MANAGEMENT: Increase Prozac to 20 mg daily   PSYCHOTHERAPY: If you do not already have a therapist, find one by contact your insurance provider for a list of in-network providers. Time-limited, psychotherapy programs are available through [place] by calling [phone]. Another source for therapists is http://www.psychology-today.com/.  Return to [facility] in as needed, or anytime if necessary (but not for routine matters like refills). IPAC hours are 8a-4pm Monday thru Friday, excluding Major Holidays. IPAC will see patients on walk-in basis from 01/21/21.       ABOUT YOUR MEDICATIONS:    Increase Prozac 20 mg daily.        [place] [facility]    [facility] Call Center  - [phone] 24/7/365  For admissions and screening for all [facility] Services, including:  Comprehensive Addiction Treatment Services (CATS) Inpatient Detox, IOP Intensive Outpatient Programs  Partial Hospitalization Program (PHP), Outpatient psychiatry, Outpatient counseling        [facility]  [facility]  [address]  [place], [place] [address] For Urgent Adult (18 and Over) Psychiatric Assessments [phone]     [facility]  [address]  [place], [place] [address]   For Child and Youth (Under 18) Mental Health  and Substance Abuse Outpatient Services [phone]   [facility]- [place]  [address]  [place], [place] [address] For Non-Urgent Psychiatric Appointments: [phone]   [facility]-   [facility]  [address]  [place], [place] [address]   For Non-Urgent Psychiatric Appointments: [phone]   [facility] [place]  [address] [place], [place] [address]   For Non-Urgent Psychiatric Appointments: [phone]   [facility]- [address]  [address]  [place], [place] [address]   For Non-Urgent Psychiatric Appointments: [phone]   [facility] [place]  [address]   [place], [place] [address]   For Non-Urgent Psychiatric Appointments: [phone]         COMMUNITY RESOURCES (MENTAL HEALTH CENTERS):      [facility]  Entry and Referral Services [phone]     [facility], [place], [place] [phone]   [place], [place], [place] [phone]     [place], [place], [place] [phone]     [facility], [place], [place] [phone]     [facility], [place], [place]  [phone]       [facility], [place], [place] [phone]     [facility]illie Manners  [facility], [place], [place]       [place]  [place]) [phone]              G[place]lord Shih [phone]

## 2021-09-11 NOTE — Progress Notes (Signed)
Pt came into office,  writer first had pt verify their full name and DOB, then reviewed medications, vitamins, and allergies.Preferred pharmacy is noted. Writer took pt's vitals. Writer also reviewed pt's medical hx, surgical hx, and substance use hx.

## 2021-09-11 NOTE — Progress Notes (Signed)
[facility] Psychiatric Follow-Up    Date/Time:   09/11/2021  9:11 AM  Name:  Jerome Moody, Jerome Moody  MRN:   [id number]  Age:   30 y.o.  DOB:   April 17, 1991  SEX:   male    CHIEF COMPLAINT:  follow up, gloomy, less energy.    HISTORY OF PRESENT ILLNESS:   Jerome Moody, a 30 y.o. male with history of anxiety and depression, presents for follow-up appointment. Patient was initially seen on 06/14/21 due to anxiety and depression related to stressors to child custody hearing. Patient had no history of any psychological problems prior to custody dispute. Patient is  asking for more time with 78 yo daughter. Daughter currently spends every other weekend with him. Patient now lives 5 minutes away from daughter. Patient states that his symptoms has been a lot better. He feels he is no longer depressed or anxious since on Prozac. He realized that he was anxious/depressed last time he was at this Clinic.  He talked about result of custody hearing, and that he is appealing this. His next hearing is March 2023. He has to get a new lawyer since previous lawyer did a bad representation. Talked about difficulty with ex-girlfriend, mother of daughter who placed a recording device on daughter, recording her, which made him paranoid at that time. He denies any paranoid thoughts currently.  Patient endorses mood to be fine, not feeling depressed or anxious. Sleep, appetite are good. He never had any suicidal thoughts before or after medication. However, he states that he wants to be more social and wants more energy.    Medication Compliance/Substance Use: appropriate as assessed    Outpatient Medications Marked as Taking for the 09/11/21 encounter (Office Visit) with Merdis Delay, MD   Medication Sig Dispense Refill    FLUoxetine (PROzac) 10 MG capsule Take 1 capsule (10 mg total) by mouth daily 30 capsule 2       PSYCHIATRIC SPECIALTY & MENTAL STATUS EXAMINATION:     Vital Signs  BP 135/76   Pulse 82   Temp 97 F  (36.1 C)   Ht 1.829 m (6')   Wt 95.3 kg (210 lb)   SpO2 97%   BMI 28.48 kg/m    General Appearance  Neatly groomed, appropriately dressed and adequately nourished   Behavior/Movements  Appropriate/Cooperative   Speech/Language  Normal Rate, Rhythm, Volume, and Tone; Appropriate Language Use   Thought Process  Logical, Linear, Goal Directed   Thought Content  No evidence of homicidal, suicidal, violent, or delusional thought content   Perceptions  No evidence of hallucinosis   Insight/Judgment   Good   Cognition  Sensorium clear, adequate fund of knowledge   Mood   confident   Affect  Full-Range / Expressive     SCREENS/LABS/IMAGING:   PHQ-9  Little interest or pleasure in doing things: 0 (09/11/2021  9:06 AM)  Feeling down, depressed, or hopeless: 0 (09/11/2021  9:06 AM)  Trouble falling or staying asleep, or sleeping too much: 0 (09/11/2021  9:06 AM)  Feeling tired or having little energy: 0 (09/11/2021  9:06 AM)  Poor appetite or overeating: 0 (09/11/2021  9:06 AM)  Feeling bad about yourself - or that you are a failure or have let yourself or your family down: 0 (09/11/2021  9:06 AM)  Trouble concentrating on things, such as reading the newspaper or watching television: 0 (09/11/2021  9:06 AM)  Moving or speaking so slowly that other people could have noticed. Or the  opposite - being so fidgety or restless that you have been moving around a lot more than usual: 0 (09/11/2021  9:06 AM)  Thoughts that you would be better off dead, or of hurting yourself in some way: 0 (09/11/2021  9:06 AM)  PHQ Total Score: 0 (09/11/2021  9:06 AM)  If you checked off any problems, how difficult have these problems made it for you to do your work, take care of things at home, or get along with other people?: Not difficult at all (09/11/2021  9:06 AM)    Please Note: The PHQ-9 is a screening tool to aid in diagnosis and tracking of depressive disorders, and is not validated to independently determine level of risk.      ASSESSMENT    Jerome Moody is a 30 y.o. male with no history of mental problems, started to have feeling of anxiety and depression due to child custody issues. Since being on Prozac 10 mg, he no longer feels anxious or depressed. His motivation increased, enjoying time with daughter. He is functioning well and now has a job as a Clinical research associate. However, he feels that he wants Prozac increased to help more with energy and socialization. He denies adverse effects on medication except for a small rash on his neck on 2nd day which disappeared in 2 days. Plan is to increase Prozac to 20 mg daily. Risks and benefits and alternative of medication are discussed including increase in depression or having SI. Advised individual psychotherapy. 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 Patient will look for therapist and regular provider once his Health Insurance kicks in. Patient understood and agreed to plan of care.         Diagnoses  Encounter Diagnosis   Name Primary?    Adjustment disorder, unspecified type Yes       Risk assessment:  -The patient does not appear to be at imminent risk of self-harm or violence, though any future actions are unpredictable.      TREATMENT PLAN:   Treatment options and alternatives reviewed with patient, along with detailed discussion of medication(s) and side effects, and they concur with following plan:     Goals:   Patient will take prescribed medication responsibly at times ordered by the physician.  Patient will report the effectiveness of medications and any side effects that develop.  Patient will report evidence of the degree of improvement in symptoms.  Patient will attend follow-up appointments as scheduled by the physician.    Problem(s): Depression, anxieity   Goal: Improvement in symptoms as evidenced by PHQ-9 and self-report   Status Stable/Improved; at goal   Plan: Increase  Prozac to 20 mg daily       Risk/benefits/alternatives/side effects of medications and treatment modalities discussed. Patient gives informed consent.    Target Date: Anticipate achievement of above goal(s) within 90 days. Will re-assess as needed.    Discharge Criteria:    1. Individual's symptomology and level of functioning have improved such that psychopharmacological interventions with specialist are not required.  2. Individual demonstrates lack of motivation or commitment to participate in treatment.     THERAPY:   N/A    DISPOSITION & FOLLOW UP:   Follow up: in 1 month with a regular provider    Patient has number to [facility] 24/7 central access line, and is aware to call 911 or ER in case of psychiatric emergency.    Merdis Delay, MD

## 2021-09-11 NOTE — Progress Notes (Deleted)
ibh

## 2021-12-07 ENCOUNTER — Other Ambulatory Visit (HOSPITAL_BASED_OUTPATIENT_CLINIC_OR_DEPARTMENT_OTHER): Payer: Self-pay | Admitting: Psychiatry

## 2024-01-06 ENCOUNTER — Ambulatory Visit (INDEPENDENT_AMBULATORY_CARE_PROVIDER_SITE_OTHER): Payer: Self-pay | Admitting: Family Nurse Practitioner

## 2024-01-06 ENCOUNTER — Ambulatory Visit (INDEPENDENT_AMBULATORY_CARE_PROVIDER_SITE_OTHER): Payer: Self-pay

## 2024-01-06 ENCOUNTER — Encounter (INDEPENDENT_AMBULATORY_CARE_PROVIDER_SITE_OTHER): Payer: Self-pay

## 2024-01-06 VITALS — BP 138/87 | HR 91 | Temp 97.6°F | Resp 18 | Ht 72.0 in | Wt 200.8 lb

## 2024-01-06 DIAGNOSIS — R052 Subacute cough: Secondary | ICD-10-CM

## 2024-01-06 DIAGNOSIS — J209 Acute bronchitis, unspecified: Secondary | ICD-10-CM

## 2024-01-06 DIAGNOSIS — M940 Chondrocostal junction syndrome [Tietze]: Secondary | ICD-10-CM

## 2024-01-06 MED ORDER — MELOXICAM 7.5 MG PO TABS
7.5000 mg | ORAL_TABLET | Freq: Every day | ORAL | 0 refills | Status: AC
Start: 2024-01-06 — End: 2024-01-13

## 2024-01-06 MED ORDER — BENZONATATE 200 MG PO CAPS
200.0000 mg | ORAL_CAPSULE | Freq: Three times a day (TID) | ORAL | 0 refills | Status: AC | PRN
Start: 2024-01-06 — End: 2024-01-16

## 2024-01-06 NOTE — Progress Notes (Signed)
 Patient: Jerome Moody    Date: 01/06/2024   MRN: [id number]            Subjective        Chief Complaint   Patient presents with    Cough     Has had cough x 5 wks has improved slightly through previous antibiotics (doxycycline) but has had pain on L side of ribs when breathing deep  Has hx of asthma  OTC: advil @6AM          HPI   Jerome Moody is a 33 y.o. male presenting to Urgent Care with complaint of non productive cough and left sided chest chest wall pain   that has been present x 2-3 days   Pain is reproducible with cough and arm movement   Pain improves with rest     Was seen via televisit at initial onset of symptoms   Pt reports completed prescribed doxycycline that only improved cough minimally  Cough has been daily since (total approx 5 weeks)  Cough is worse in the mornings     He had left over "Amoxicillin" that he started taking (once daily of unknown strength) that he completed approx 4 days ago   Cough is improving and no reports of breathing problems or SOB    Pt reports hx of pneumothorax       Patient denies fever, vomiting, diarrhea, and chest pain.      History:  Pertinent Past Medical, Surgical, Family and Social History were reviewed.   Current Medications[1]  Allergies[2]  Medications and Allergies reviewed.         Objective   Vitals:    01/06/24 1126   BP: 138/87   BP Site: Left arm   Patient Position: Sitting   Cuff Size: Medium   Pulse: 91   Resp: 18   Temp: 97.6 F (36.4 C)   TempSrc: Tympanic   SpO2: 99%   Weight: 91.1 kg (200 lb 12.8 oz)   Height: 1.829 m (6')     Body mass index is 27.23 kg/m.    Physical Exam  Vitals and nursing note reviewed.   Constitutional:       General: He is not in acute distress.     Appearance: Normal appearance. He is not ill-appearing or toxic-appearing.   HENT:      Head: Normocephalic.      Right Ear: Tympanic membrane, ear canal and external ear normal.      Left Ear: Tympanic membrane, ear canal and external ear normal.      Nose: No congestion or  rhinorrhea.      Mouth/Throat:      Pharynx: No oropharyngeal exudate or posterior oropharyngeal erythema.   Eyes:      Extraocular Movements: Extraocular movements intact.      Conjunctiva/sclera: Conjunctivae normal.   Cardiovascular:      Rate and Rhythm: Normal rate and regular rhythm.      Pulses: Normal pulses.      Heart sounds: Normal heart sounds.   Pulmonary:      Effort: Pulmonary effort is normal. No respiratory distress.      Breath sounds: No wheezing, rhonchi or rales.   Musculoskeletal:      Cervical back: Normal range of motion and neck supple.   Lymphadenopathy:      Cervical: No cervical adenopathy.   Skin:     General: Skin is warm and dry.   Neurological:      General: No  focal deficit present.      Mental Status: He is alert and oriented to person, place, and time.   Psychiatric:         Mood and Affect: Mood normal.         Behavior: Behavior normal.              UCC Course   There were no labs reviewed with this patient during the visit.  X-Ray  The following X-ray studies were ordered, visualized and independently interpreted by me. Results were discussed with the patient/family.     XR Chest 2 Views    Result Date: 01/06/2024  HISTORY: subacute cough x 5 weeks not improved with doxycycline, currently left anterior chest wall pain with cough.  COMPARISON: None. FINDINGS: The lungs are clear. No pleural effusion or pneumothorax. Normal mediastinal silhouette. No acute osseous abnormality.      No acute abnormality within the chest.  [name] 01/06/2024 11:59 AM    Current Inpatient Medications with Last Dose Taken[3]       Procedures   Procedures      MDM/Assessment      X-ray obtained and personally reviewed as well as radiologist interpretation/findings discussed with patient/family.               Differential Diagnosis: Sinusitis, Bronchitis, Pharyngitis, Pneumonia, Influenza, COVID-19 and Allergic Rhinitis  Encounter Diagnoses   Name Primary?    Subacute cough     Acute bronchitis,  unspecified organism     Costochondritis Yes            Plan    Benzonatate three times a day as needed  Nasal saline rinses twice a day  Optimize H20 hydration  Antibiotic therapy is not indicated - etiology most likely not bacterial.  Complete entire course of antibiotic therapy as directed.  Recommend further evaluation if the following symptoms develop: worsening of symptoms, fever, shortness of breath, persistent cough, wheezing or chest pain.    Orders Placed This Encounter   Procedures    XR Chest 2 Views     Requested Prescriptions     Signed Prescriptions Disp Refills    meloxicam (MOBIC) 7.5 MG tablet 7 tablet 0     Sig: Take 1 tablet (7.5 mg) by mouth daily for 7 days    benzonatate (TESSALON) 200 MG capsule 30 capsule 0     Sig: Take 1 capsule (200 mg) by mouth 3 (three) times daily as needed for Cough       Discussed results and diagnosis with patient/family.  Reviewed warning signs for worsening condition, as well as, indications for follow-up with primary care physician and return to urgent care clinic.   Patient/family expressed understanding of instructions.     An After Visit Summary with pertinent information was made available to patient/family via MyChart or in-print.           [1]   Current Outpatient Medications:     amphetamine-dextroamphetamine (ADDERALL XR) 15 MG 24 hr capsule, Take 1 capsule (15 mg) by mouth every morning, Disp: , Rfl:     FLUoxetine (PROzac) 40 MG capsule, Take 1 capsule (40 mg) by mouth daily, Disp: , Rfl:     guanFACINE (TENEX) 1 MG tablet, Take 1 tablet (1 mg) by mouth nightly, Disp: , Rfl:     benzonatate (TESSALON) 200 MG capsule, Take 1 capsule (200 mg) by mouth 3 (three) times daily as needed for Cough, Disp: 30 capsule, Rfl: 0  ibuprofen (ADVIL,MOTRIN) 600 MG tablet, Take 1 tablet (600 mg total) by mouth every 6 (six) hours as needed for Pain (Patient not taking: Reported on 01/06/2024), Disp: 15 tablet, Rfl: 0    meloxicam (MOBIC) 7.5 MG tablet, Take 1 tablet  (7.5 mg) by mouth daily for 7 days, Disp: 7 tablet, Rfl: 0  [2]   Allergies  Allergen Reactions    Singulair [Montelukast]    [3]   No current facility-administered medications for this visit.

## 2024-01-06 NOTE — Progress Notes (Signed)
 Subjective:      Jerome Moody is a 33 y.o. male who presents for evaluation of chest pain. Onset was 5 week ago. Symptoms have worsened since that time. The patient describes the pain as  Sharp  and does not radiate. Patient rates pain as a 7/10 in intensity. Associated symptoms are:  cough . Aggravating factors are: deep inspiration and raising arm cough . Alleviating factors are: rest. Patient's cardiac risk factors are: male gender, smoking/ tobacco exposure, and HX of asthma and pneumomediastinum . Patient's risk factors for DVT/PE:    . Previous cardiac testing: chest x-ray.        Review of Systems  Pertinent items are noted in HPI.      Objective:      Lungs: clear to auscultation bilaterally  Chest wall: no tenderness  Heart: regular rate and rhythm and S1, S2 normal    Cardiographics  ECG:       Imaging  Chest x-ray: normal chest x-ray      Assessment:      Chest pain, suspected etiology: costochondritis      Plan:      Patient history and exam consistent with non-cardiac cause of chest pain.  Prescription NSAIDs per medication orders.
